# Patient Record
Sex: Female | Born: 1990 | Race: White | Hispanic: No | Smoking: Never smoker
Health system: Southern US, Community
[De-identification: ages and names within clinical notes are randomized; demographics above are authoritative.]

## PROBLEM LIST (undated history)

## (undated) DIAGNOSIS — J45909 Unspecified asthma, uncomplicated: Secondary | ICD-10-CM

## (undated) DIAGNOSIS — T7840XA Allergy, unspecified, initial encounter: Secondary | ICD-10-CM

## (undated) DIAGNOSIS — I10 Essential (primary) hypertension: Secondary | ICD-10-CM

## (undated) DIAGNOSIS — F32A Depression, unspecified: Secondary | ICD-10-CM

## (undated) HISTORY — DX: Depression, unspecified: F32.A

## (undated) HISTORY — DX: Unspecified asthma, uncomplicated: J45.909

## (undated) HISTORY — DX: Allergy, unspecified, initial encounter: T78.40XA

## (undated) HISTORY — PX: WISDOM TOOTH EXTRACTION: SHX21

---
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
## 2011-11-08 ENCOUNTER — Emergency Department: Payer: Self-pay | Admitting: Internal Medicine

## 2013-04-22 ENCOUNTER — Emergency Department: Payer: Self-pay | Admitting: Emergency Medicine

## 2017-03-02 ENCOUNTER — Encounter: Payer: Self-pay | Admitting: Emergency Medicine

## 2017-03-02 DIAGNOSIS — I10 Essential (primary) hypertension: Secondary | ICD-10-CM | POA: Insufficient documentation

## 2017-03-02 DIAGNOSIS — R109 Unspecified abdominal pain: Secondary | ICD-10-CM | POA: Diagnosis present

## 2017-03-02 DIAGNOSIS — M791 Myalgia: Secondary | ICD-10-CM | POA: Insufficient documentation

## 2017-03-02 LAB — URINALYSIS, COMPLETE (UACMP) WITH MICROSCOPIC
Bilirubin Urine: NEGATIVE
Glucose, UA: NEGATIVE mg/dL
Ketones, ur: 5 mg/dL — AB
Nitrite: NEGATIVE
PROTEIN: 30 mg/dL — AB
Specific Gravity, Urine: 1.031 — ABNORMAL HIGH (ref 1.005–1.030)
pH: 5 (ref 5.0–8.0)

## 2017-03-02 LAB — POCT PREGNANCY, URINE: PREG TEST UR: NEGATIVE

## 2017-03-02 NOTE — ED Triage Notes (Signed)
Patient with right side pain, diarrhea and decrease appetite that started yesterday.

## 2017-03-03 ENCOUNTER — Emergency Department: Payer: Medicaid Other

## 2017-03-03 ENCOUNTER — Emergency Department
Admission: EM | Admit: 2017-03-03 | Discharge: 2017-03-03 | Disposition: A | Discharge status: Home / Self Care | Payer: Medicaid Other | Attending: Emergency Medicine | Admitting: Emergency Medicine

## 2017-03-03 DIAGNOSIS — M7918 Myalgia, other site: Secondary | ICD-10-CM

## 2017-03-03 HISTORY — DX: Essential (primary) hypertension: I10

## 2017-03-03 LAB — COMPREHENSIVE METABOLIC PANEL
ALBUMIN: 4.1 g/dL (ref 3.5–5.0)
ALT: 18 U/L (ref 14–54)
AST: 22 U/L (ref 15–41)
Alkaline Phosphatase: 48 U/L (ref 38–126)
Anion gap: 7 (ref 5–15)
BUN: 8 mg/dL (ref 6–20)
CHLORIDE: 108 mmol/L (ref 101–111)
CO2: 25 mmol/L (ref 22–32)
CREATININE: 0.87 mg/dL (ref 0.44–1.00)
Calcium: 9.3 mg/dL (ref 8.9–10.3)
GFR calc Af Amer: >60 mL/min (ref >60)
GLUCOSE: 98 mg/dL (ref 65–99)
POTASSIUM: 3.3 mmol/L — AB (ref 3.5–5.1)
Sodium: 140 mmol/L (ref 135–145)
Total Bilirubin: 0.8 mg/dL (ref 0.3–1.2)
Total Protein: 7.3 g/dL (ref 6.5–8.1)

## 2017-03-03 LAB — CBC
HEMATOCRIT: 38.4 % (ref 35.0–47.0)
Hemoglobin: 13.2 g/dL (ref 12.0–16.0)
MCH: 29.3 pg (ref 26.0–34.0)
MCHC: 34.3 g/dL (ref 32.0–36.0)
MCV: 85.5 fL (ref 80.0–100.0)
PLATELETS: 278 10*3/uL (ref 150–440)
RBC: 4.5 MIL/uL (ref 3.80–5.20)
RDW: 14.9 % — ABNORMAL HIGH (ref 11.5–14.5)
WBC: 5.3 10*3/uL (ref 3.6–11.0)

## 2017-03-03 LAB — LIPASE, BLOOD: LIPASE: 24 U/L (ref 11–51)

## 2017-03-03 LAB — CK: Total CK: 99 U/L (ref 38–234)

## 2017-03-03 NOTE — Discharge Instructions (Signed)
Fortunately today your blood work and her CT scan were reassuring. Please follow-up with your primary care physician as needed and return to the emergency department for any concerns.  It was a pleasure to take care of you today, and thank you for coming to our emergency department.  If you have any questions or concerns before leaving please ask the nurse to grab me and I'm more than happy to go through your aftercare instructions again.  If you were prescribed any opioid pain medication today such as Norco, Vicodin, Percocet, morphine, hydrocodone, or oxycodone please make sure you do not drive when you are taking this medication as it can alter your ability to drive safely.  If you have any concerns once you are home that you are not improving or are in fact getting worse before you can make it to your follow-up appointment, please do not hesitate to call 911 and come back for further evaluation.  Darel Hong, MD  Results for orders placed or performed during the hospital encounter of 03/03/17  Lipase, blood  Result Value Ref Range   Lipase 24 11 - 51 U/L  Comprehensive metabolic panel  Result Value Ref Range   Sodium 140 135 - 145 mmol/L   Potassium 3.3 (L) 3.5 - 5.1 mmol/L   Chloride 108 101 - 111 mmol/L   CO2 25 22 - 32 mmol/L   Glucose, Bld 98 65 - 99 mg/dL   BUN 8 6 - 20 mg/dL   Creatinine, Ser 0.87 0.44 - 1.00 mg/dL   Calcium 9.3 8.9 - 10.3 mg/dL   Total Protein 7.3 6.5 - 8.1 g/dL   Albumin 4.1 3.5 - 5.0 g/dL   AST 22 15 - 41 U/L   ALT 18 14 - 54 U/L   Alkaline Phosphatase 48 38 - 126 U/L   Total Bilirubin 0.8 0.3 - 1.2 mg/dL   GFR calc non Af Amer >60 >60 mL/min   GFR calc Af Amer >60 >60 mL/min   Anion gap 7 5 - 15  CBC  Result Value Ref Range   WBC 5.3 3.6 - 11.0 K/uL   RBC 4.50 3.80 - 5.20 MIL/uL   Hemoglobin 13.2 12.0 - 16.0 g/dL   HCT 38.4 35.0 - 47.0 %   MCV 85.5 80.0 - 100.0 fL   MCH 29.3 26.0 - 34.0 pg   MCHC 34.3 32.0 - 36.0 g/dL   RDW 14.9 (H) 11.5 -  14.5 %   Platelets 278 150 - 440 K/uL  Urinalysis, Complete w Microscopic  Result Value Ref Range   Color, Urine AMBER (A) YELLOW   APPearance TURBID (A) CLEAR   Specific Gravity, Urine 1.031 (H) 1.005 - 1.030   pH 5.0 5.0 - 8.0   Glucose, UA NEGATIVE NEGATIVE mg/dL   Hgb urine dipstick LARGE (A) NEGATIVE   Bilirubin Urine NEGATIVE NEGATIVE   Ketones, ur 5 (A) NEGATIVE mg/dL   Protein, ur 30 (A) NEGATIVE mg/dL   Nitrite NEGATIVE NEGATIVE   Leukocytes, UA MODERATE (A) NEGATIVE   RBC / HPF 0-5 0 - 5 RBC/hpf   WBC, UA 6-30 0 - 5 WBC/hpf   Bacteria, UA RARE (A) NONE SEEN   Squamous Epithelial / LPF 6-30 (A) NONE SEEN   Mucous PRESENT    Amorphous Crystal PRESENT   CK  Result Value Ref Range   Total CK 99 38 - 234 U/L  Pregnancy, urine POC  Result Value Ref Range   Preg Test, Ur NEGATIVE NEGATIVE  Ct Renal Stone Study  Result Date: 03/03/2017 CLINICAL DATA:  Initial evaluation for acute flank pain. EXAM: CT ABDOMEN AND PELVIS WITHOUT CONTRAST TECHNIQUE: Multidetector CT imaging of the abdomen and pelvis was performed following the standard protocol without IV contrast. COMPARISON:  None. FINDINGS: Lower chest: Lung bases are clear. Hepatobiliary: Liver demonstrates a normal unenhanced appearance. Gallbladder normal. No biliary dilatation. Pancreas: Pancreas within normal limits. Spleen: Spleen within normal limits. Adrenals/Urinary Tract: Adrenal glands are normal. Kidneys equal in size without evidence for nephrolithiasis or hydronephrosis. No radiopaque calculi seen along the course of either renal collecting system. No hydroureter. Bladder partially distended without acute abnormality. No layering stones within the bladder lumen. Stomach/Bowel: Stomach within normal limits. No evidence for bowel obstruction. Appendix within normal limits. No acute inflammatory changes seen about the bowels. Vascular/Lymphatic: Intra-abdominal aorta of normal caliber. No adenopathy. Reproductive: Uterus  and ovaries within normal limits. Other: No free air or fluid. Fat containing paraumbilical hernia noted. Musculoskeletal: No acute osseus abnormality. No worrisome lytic or blastic osseous lesions. 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 IMPRESSION: 1. No CT evidence for nephrolithiasis or obstructive uropathy. 2. No other acute intra-abdominal or pelvic process. 3. Chronic bilateral pars defects at L5 with associated grade 1 spondylolisthesis. Electronically Signed   By: Jeannine Boga M.D.   On: 03/03/2017 03:35

## 2017-03-03 NOTE — ED Provider Notes (Signed)
[facility] Emergency Department Provider Note  ____________________________________________   First MD Initiated Contact with Patient 03/03/17 0302     (approximate)  I have reviewed the triage vital signs and the nursing notes.   HISTORY  Chief Complaint Abdominal Pain    HPI Denise Contreras is a 26 y.o. female who comes to the emergency Department with roughly 1 week of right-sided abdominal pain. The pain is mild to moderate. It is worse with exercising improved when not exercising. She denies dysuria frequency hesitancy. She denies fevers or chills. She denies abdominal surgical history. She says she has recently started going to the gym and is alert he lost 15 pounds and she is not sure if she's overexerting herself or not. Her friend said it could be her gallbladder so she is worried. Her pain is not postprandial.   Past Medical History:  Diagnosis Date  . Hypertension     There are no active problems to display for this patient.   History reviewed. No pertinent surgical history.  Prior to Admission medications   Not on File    Allergies Sulfa antibiotics  No family history on file.  Social History Social History  Substance Use Topics  . Smoking status: Never Smoker  . Smokeless tobacco: Never Used  . Alcohol use Not on file    Review of Systems Constitutional: No fever/chills Eyes: No visual changes. ENT: No sore throat. Cardiovascular: Denies chest pain. Respiratory: Denies shortness of breath. Gastrointestinal: Positive abdominal pain.  No nausea, no vomiting.  No diarrhea.  No constipation. Genitourinary: Negative for dysuria. Musculoskeletal: Positive for back pain. Skin: Negative for rash. Neurological: Negative for headaches, focal weakness or numbness.   ____________________________________________   PHYSICAL EXAM:  VITAL SIGNS: ED Triage Vitals  Enc Vitals Group     BP 03/02/17 2326 (!) 149/95     Pulse  Rate 03/02/17 2326 65     Resp 03/02/17 2326 18     Temp 03/02/17 2326 98.6 F (37 C)     Temp Source 03/02/17 2326 Oral     SpO2 03/02/17 2326 99 %     Weight 03/02/17 2320 280 lb (127 kg)     Height 03/02/17 2320 5\' 3"  (1.6 m)     Head Circumference --      Peak Flow --      Pain Score 03/02/17 2320 1     Pain Loc --      Pain Edu? --      Excl. in [place]? --     Constitutional: Alert and oriented 4 well appearing nontoxic no diaphoresis speaks in full clear sentences Eyes: PERRL EOMI. Head: Atraumatic. Nose: No congestion/rhinnorhea. Mouth/Throat: No trismus Neck: No stridor.   Cardiovascular: Normal rate, regular rhythm. Grossly normal heart sounds.  Good peripheral circulation. Respiratory: Normal respiratory effort.  No retractions. Lungs CTAB and moving good air Gastrointestinal: Soft nondistended nontender no rebound or guarding no peritonitis no costovertebral tenderness negative Murphy's no McBurney's tenderness negative Rovsing's No midline back tenderness Musculoskeletal: No lower extremity edema   Neurologic:  Normal speech and language. No gross focal neurologic deficits are appreciated. Skin:  Skin is warm, dry and intact. No rash noted. Psychiatric: Mood and affect are normal. Speech and behavior are normal.    ____________________________________________   DIFFERENTIAL includes but not limited to  Musculoskeletal pain, rhabdomyolysis, renal colic, pyelonephritis, urinary tract infection ____________________________________________   LABS (all labs ordered are listed, but only abnormal results are displayed)  Labs Reviewed  COMPREHENSIVE METABOLIC PANEL - Abnormal; Notable for the following:       Result Value   Potassium 3.3 (*)    All other components within normal limits  CBC - Abnormal; Notable for the following:    RDW 14.9 (*)    All other components within normal limits  URINALYSIS, COMPLETE (UACMP) WITH MICROSCOPIC - Abnormal; Notable for the  following:    Color, Urine AMBER (*)    APPearance TURBID (*)    Specific Gravity, Urine 1.031 (*)    Hgb urine dipstick LARGE (*)    Ketones, ur 5 (*)    Protein, ur 30 (*)    Leukocytes, UA MODERATE (*)    Bacteria, UA RARE (*)    Squamous Epithelial / LPF 6-30 (*)    All other components within normal limits  LIPASE, BLOOD  CK  POC URINE PREG, ED  POCT PREGNANCY, URINE    Urine dipstick positive for blood with negative microscopy concerning for rhabdomyolysis although CK is normal. No evidence of infection __________________________________________  EKG   ____________________________________________  RADIOLOGY  CT scan renal protocol negative for acute pathology ____________________________________________   PROCEDURES  Procedure(s) performed: no  Procedures  Critical Care performed: no  Observation: no ____________________________________________   INITIAL IMPRESSION / ASSESSMENT AND PLAN / ED COURSE  Pertinent labs & imaging results that were available during my care of the patient were reviewed by me and considered in my medical decision making (see chart for details).  The patient arrives very well-appearing with a benign exam and declines pain medication. Her urinalysis is concerning for rhabdomyolysis especially in the setting of recently initiating work at the gym but fortunately her CK is negative. She has no infectious symptoms. Given the hematuria I was also concerned for renal colic however his CT scan is negative. This either represents a tiny stone that did not fit between the cuts of the CT scan versus musculoskeletal pain. Regardless she is medically stable for outpatient management   ____________________________________________   FINAL CLINICAL IMPRESSION(S) / ED DIAGNOSES  Final diagnoses:  Musculoskeletal pain      NEW MEDICATIONS STARTED DURING THIS VISIT:  There are no discharge medications for this patient.    Note:  This  document was prepared using Dragon voice recognition software and may include unintentional dictation errors.     Darel Hong, MD 03/03/17 0600

## 2017-03-03 NOTE — ED Notes (Signed)
Patient returned from CT

## 2018-06-20 DIAGNOSIS — S2221XA Fracture of manubrium, initial encounter for closed fracture: Secondary | ICD-10-CM | POA: Insufficient documentation

## 2018-08-19 DIAGNOSIS — I10 Essential (primary) hypertension: Secondary | ICD-10-CM | POA: Diagnosis not present

## 2018-08-19 DIAGNOSIS — J45909 Unspecified asthma, uncomplicated: Secondary | ICD-10-CM | POA: Diagnosis not present

## 2018-08-19 DIAGNOSIS — E669 Obesity, unspecified: Secondary | ICD-10-CM | POA: Diagnosis not present

## 2018-08-19 DIAGNOSIS — O0992 Supervision of high risk pregnancy, unspecified, second trimester: Secondary | ICD-10-CM | POA: Diagnosis not present

## 2018-08-19 DIAGNOSIS — O99210 Obesity complicating pregnancy, unspecified trimester: Secondary | ICD-10-CM | POA: Diagnosis not present

## 2018-08-19 DIAGNOSIS — Z6841 Body Mass Index (BMI) 40.0 and over, adult: Secondary | ICD-10-CM | POA: Diagnosis not present

## 2018-08-19 DIAGNOSIS — R319 Hematuria, unspecified: Secondary | ICD-10-CM | POA: Diagnosis not present

## 2018-08-21 DIAGNOSIS — Z3A22 22 weeks gestation of pregnancy: Secondary | ICD-10-CM | POA: Diagnosis not present

## 2018-08-21 DIAGNOSIS — O99212 Obesity complicating pregnancy, second trimester: Secondary | ICD-10-CM | POA: Diagnosis not present

## 2018-12-10 DIAGNOSIS — Z1159 Encounter for screening for other viral diseases: Secondary | ICD-10-CM | POA: Diagnosis not present

## 2018-12-12 DIAGNOSIS — O1002 Pre-existing essential hypertension complicating childbirth: Secondary | ICD-10-CM | POA: Diagnosis not present

## 2018-12-12 DIAGNOSIS — O9952 Diseases of the respiratory system complicating childbirth: Secondary | ICD-10-CM | POA: Diagnosis not present

## 2018-12-12 DIAGNOSIS — O99214 Obesity complicating childbirth: Secondary | ICD-10-CM | POA: Diagnosis not present

## 2018-12-12 DIAGNOSIS — Z3A39 39 weeks gestation of pregnancy: Secondary | ICD-10-CM | POA: Diagnosis not present

## 2018-12-12 DIAGNOSIS — E669 Obesity, unspecified: Secondary | ICD-10-CM | POA: Diagnosis not present

## 2018-12-12 DIAGNOSIS — O99824 Streptococcus B carrier state complicating childbirth: Secondary | ICD-10-CM | POA: Diagnosis not present

## 2018-12-12 DIAGNOSIS — O10013 Pre-existing essential hypertension complicating pregnancy, third trimester: Secondary | ICD-10-CM | POA: Diagnosis not present

## 2018-12-12 DIAGNOSIS — J45909 Unspecified asthma, uncomplicated: Secondary | ICD-10-CM | POA: Diagnosis not present

## 2018-12-28 DIAGNOSIS — Z130 Encounter for screening for diseases of the blood and blood-forming organs and certain disorders involving the immune mechanism: Secondary | ICD-10-CM | POA: Diagnosis not present

## 2019-05-31 ENCOUNTER — Telehealth: Payer: Self-pay

## 2019-05-31 NOTE — Telephone Encounter (Signed)
Pre-visit screening call attempted prior to [facility] appointment on 06/02/2019. Left a message - will call again at a later time.

## 2019-06-02 ENCOUNTER — Other Ambulatory Visit: Payer: Self-pay

## 2019-06-02 ENCOUNTER — Ambulatory Visit: Payer: Medicaid Other | Attending: Oncology

## 2019-06-02 VITALS — BP 145/93 | HR 71 | Temp 98.3°F | Resp 16 | Ht 64.0 in | Wt 327.0 lb

## 2019-06-02 DIAGNOSIS — N63 Unspecified lump in unspecified breast: Secondary | ICD-10-CM

## 2019-06-02 DIAGNOSIS — Z30013 Encounter for initial prescription of injectable contraceptive: Secondary | ICD-10-CM | POA: Diagnosis not present

## 2019-06-02 DIAGNOSIS — Z0000 Encounter for general adult medical examination without abnormal findings: Secondary | ICD-10-CM | POA: Diagnosis not present

## 2019-06-02 NOTE — Progress Notes (Signed)
  Subjective:     Patient ID: Denise Contreras, female   DOB: 1991/06/26, 28 y.o.   MRN: [id number]  HPI   Review of Systems     Objective:   Physical Exam Chest:       Comments: 1.5 cm breast mass 1 o'clock with associated erythema       Assessment:     28 year old patient presents for [place] clinic visit.  Patient complains of a lump  in right breast that has been there for about a month.  States she feels like it has grown in size.  Patient screened, and meets BCCCP eligibility.  Patient does not have insurance, Medicare or Medicaid. Instructed patient on breast self awareness using teach back method.   Palpated a 1.5 cm. Mass at 1 o'clock right breast.  Dime sized erythematous skin noted at mass location.    Plan:     Scheduled to return 06/09/19, due to patient's request for work schedule.

## 2019-06-02 NOTE — Progress Notes (Signed)
Scheduled 06/09/2019 for ultrasound cancelled per patient.  Rescheduled for 06/29/2019, and results Birads 4.  [facility]ers in for biopsy.

## 2019-06-09 ENCOUNTER — Inpatient Hospital Stay: Admission: RE | Admit: 2019-06-09 | Payer: Medicaid Other | Source: Ambulatory Visit

## 2019-06-16 DIAGNOSIS — R0981 Nasal congestion: Secondary | ICD-10-CM | POA: Diagnosis not present

## 2019-06-16 DIAGNOSIS — Z882 Allergy status to sulfonamides status: Secondary | ICD-10-CM | POA: Diagnosis not present

## 2019-06-16 DIAGNOSIS — R0602 Shortness of breath: Secondary | ICD-10-CM | POA: Diagnosis not present

## 2019-06-16 DIAGNOSIS — Z79899 Other long term (current) drug therapy: Secondary | ICD-10-CM | POA: Diagnosis not present

## 2019-06-16 DIAGNOSIS — R05 Cough: Secondary | ICD-10-CM | POA: Diagnosis not present

## 2019-06-16 DIAGNOSIS — J45901 Unspecified asthma with (acute) exacerbation: Secondary | ICD-10-CM | POA: Diagnosis not present

## 2019-06-21 ENCOUNTER — Other Ambulatory Visit: Payer: Medicaid Other

## 2019-06-22 ENCOUNTER — Other Ambulatory Visit: Payer: Medicaid Other

## 2019-06-29 ENCOUNTER — Ambulatory Visit
Admission: RE | Admit: 2019-06-29 | Discharge: 2019-06-29 | Disposition: A | Discharge status: Home / Self Care | Payer: Medicaid Other | Source: Ambulatory Visit | Attending: Oncology | Admitting: Oncology

## 2019-06-29 DIAGNOSIS — N63 Unspecified lump in unspecified breast: Secondary | ICD-10-CM | POA: Insufficient documentation

## 2019-06-30 ENCOUNTER — Other Ambulatory Visit: Payer: Self-pay | Admitting: *Deleted

## 2019-06-30 DIAGNOSIS — N63 Unspecified lump in unspecified breast: Secondary | ICD-10-CM

## 2019-07-07 ENCOUNTER — Ambulatory Visit
Admission: RE | Admit: 2019-07-07 | Discharge: 2019-07-07 | Disposition: A | Discharge status: Home / Self Care | Payer: Medicaid Other | Source: Ambulatory Visit | Attending: Oncology | Admitting: Oncology

## 2019-07-07 DIAGNOSIS — N63 Unspecified lump in unspecified breast: Secondary | ICD-10-CM

## 2019-07-07 DIAGNOSIS — N6121 Granulomatous mastitis, right breast: Secondary | ICD-10-CM | POA: Diagnosis not present

## 2019-07-09 ENCOUNTER — Encounter: Payer: Self-pay | Admitting: *Deleted

## 2019-07-09 LAB — SURGICAL PATHOLOGY

## 2019-07-09 NOTE — Progress Notes (Signed)
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  Informed her she will need to be treated with antibiotics. Since the surgical offices are closed for the day, patient was informed that I will call her on Monday and get her scheduled.  She is agreeable.

## 2019-07-12 ENCOUNTER — Encounter: Payer: Self-pay | Admitting: *Deleted

## 2019-07-12 NOTE — Progress Notes (Addendum)
Left patient a message with her surgical consult appointment with Dr. Bary Castilla tomorrow at 10:00.  I requested that she confirm with a call back.  Patient returned my call and confirmed her appointment for tomorrow.

## 2019-08-04 NOTE — Progress Notes (Signed)
Received note from [facility] that patient was a no show for her appointment. This appointment was rescheduled from a previous appointment.  Left message for patient to call back with her plan to receive treatment.

## 2019-08-31 DIAGNOSIS — Z3042 Encounter for surveillance of injectable contraceptive: Secondary | ICD-10-CM | POA: Diagnosis not present

## 2019-08-31 DIAGNOSIS — Z3009 Encounter for other general counseling and advice on contraception: Secondary | ICD-10-CM | POA: Diagnosis not present

## 2019-09-02 DIAGNOSIS — N61 Mastitis without abscess: Secondary | ICD-10-CM | POA: Diagnosis not present

## 2019-09-08 ENCOUNTER — Encounter: Payer: Self-pay | Admitting: *Deleted

## 2019-09-08 NOTE — Progress Notes (Signed)
Patient was seen by Dr. Bary Castilla.  She has had complete resolution of the granulamatous.  She will follow up as needed.

## 2019-09-14 DIAGNOSIS — Z23 Encounter for immunization: Secondary | ICD-10-CM | POA: Diagnosis not present

## 2019-10-28 DIAGNOSIS — Z6841 Body Mass Index (BMI) 40.0 and over, adult: Secondary | ICD-10-CM | POA: Diagnosis not present

## 2019-10-28 DIAGNOSIS — I10 Essential (primary) hypertension: Secondary | ICD-10-CM | POA: Insufficient documentation

## 2019-10-28 DIAGNOSIS — R635 Abnormal weight gain: Secondary | ICD-10-CM | POA: Diagnosis not present

## 2019-10-28 DIAGNOSIS — Z3009 Encounter for other general counseling and advice on contraception: Secondary | ICD-10-CM | POA: Diagnosis not present

## 2019-11-19 NOTE — Progress Notes (Signed)
Copy to [place] for data to the [place].

## 2019-11-24 DIAGNOSIS — I10 Essential (primary) hypertension: Secondary | ICD-10-CM | POA: Diagnosis not present

## 2019-11-24 DIAGNOSIS — Z6841 Body Mass Index (BMI) 40.0 and over, adult: Secondary | ICD-10-CM | POA: Diagnosis not present

## 2019-11-24 DIAGNOSIS — Z3041 Encounter for surveillance of contraceptive pills: Secondary | ICD-10-CM | POA: Diagnosis not present

## 2019-11-24 DIAGNOSIS — J4520 Mild intermittent asthma, uncomplicated: Secondary | ICD-10-CM | POA: Insufficient documentation

## 2019-11-24 DIAGNOSIS — J309 Allergic rhinitis, unspecified: Secondary | ICD-10-CM | POA: Insufficient documentation

## 2019-11-24 DIAGNOSIS — Z3202 Encounter for pregnancy test, result negative: Secondary | ICD-10-CM | POA: Diagnosis not present

## 2020-01-27 DIAGNOSIS — Z419 Encounter for procedure for purposes other than remedying health state, unspecified: Secondary | ICD-10-CM | POA: Diagnosis not present

## 2020-02-04 DIAGNOSIS — Z3202 Encounter for pregnancy test, result negative: Secondary | ICD-10-CM | POA: Diagnosis not present

## 2020-02-04 DIAGNOSIS — Z308 Encounter for other contraceptive management: Secondary | ICD-10-CM | POA: Diagnosis not present

## 2020-02-04 DIAGNOSIS — I10 Essential (primary) hypertension: Secondary | ICD-10-CM | POA: Diagnosis not present

## 2020-02-27 DIAGNOSIS — Z419 Encounter for procedure for purposes other than remedying health state, unspecified: Secondary | ICD-10-CM | POA: Diagnosis not present

## 2020-03-28 IMAGING — MG US  BREAST BX W/ LOC DEV 1ST LESION IMG BX SPEC US GUIDE*R*
1 series · 8 of 8 positions shown · non-contrast
Comparison: Previous exam(s).
COMPARISON: Previous exam(s).

Addendum:
CLINICAL DATA: 28-year-old female for tissue sampling of UPPER
INNER RIGHT breast mass.

EXAM:
ULTRASOUND GUIDED RIGHT BREAST CORE NEEDLE BIOPSY

[Series 1: MG view · 0.04mm/px · 8 of 11 slices shown]
[im 1/11]
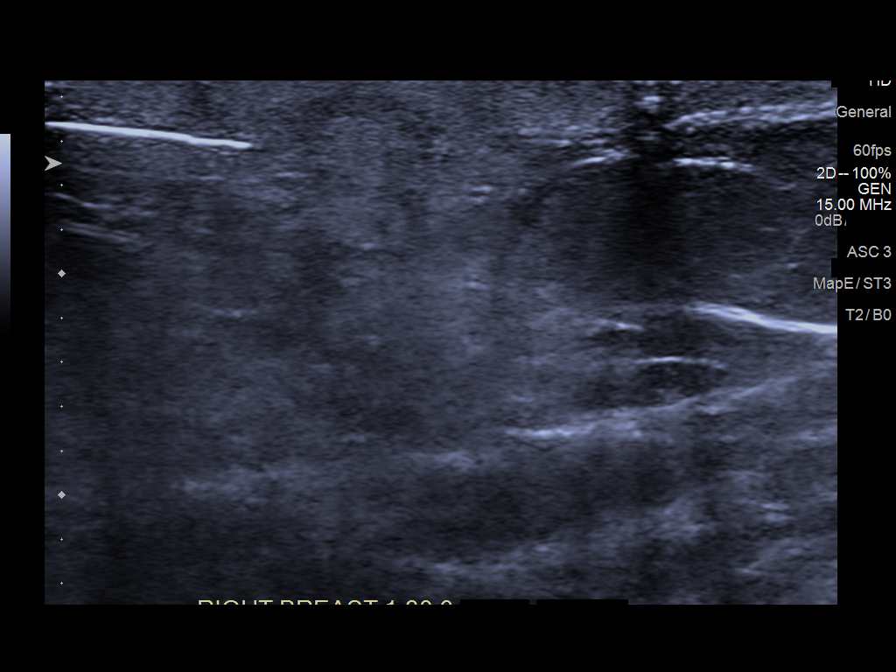
[im 2/11]
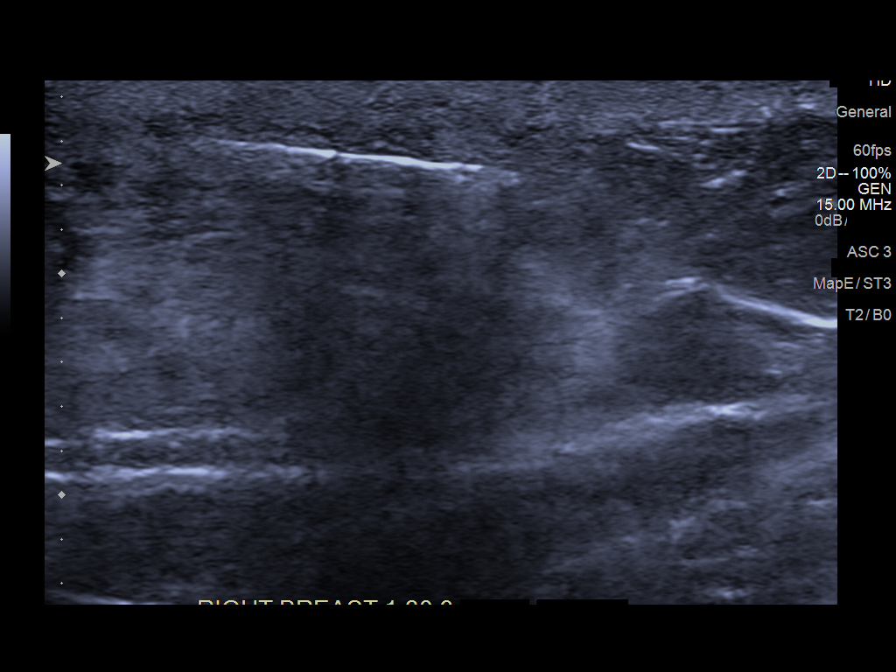
[im 3/11]
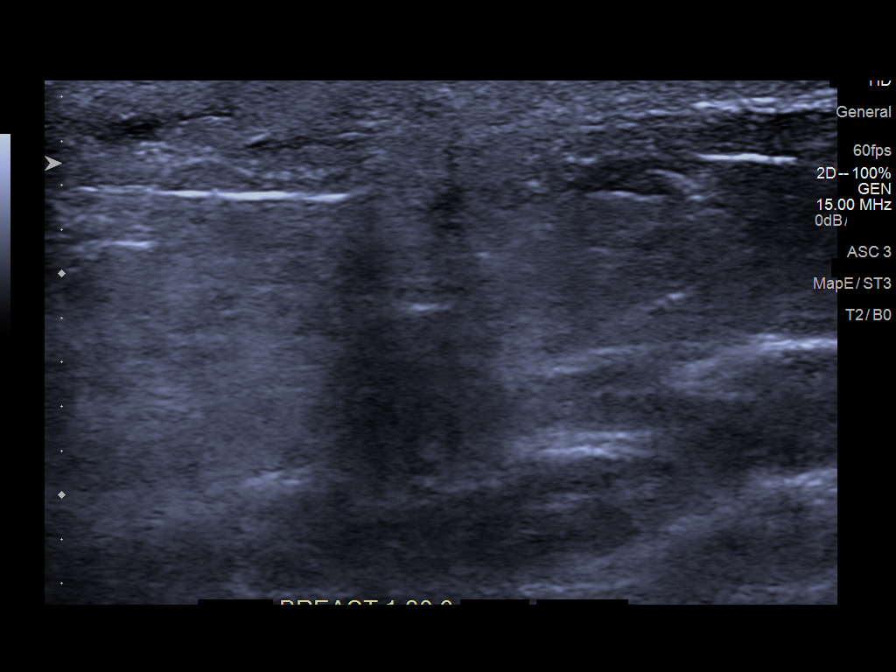
[im 5/11]
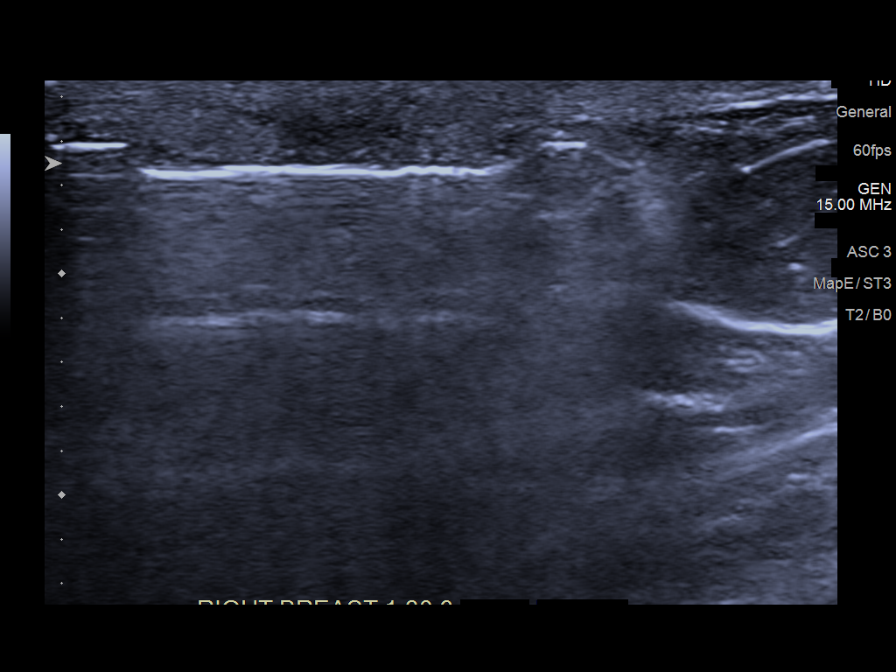
[im 6/11]
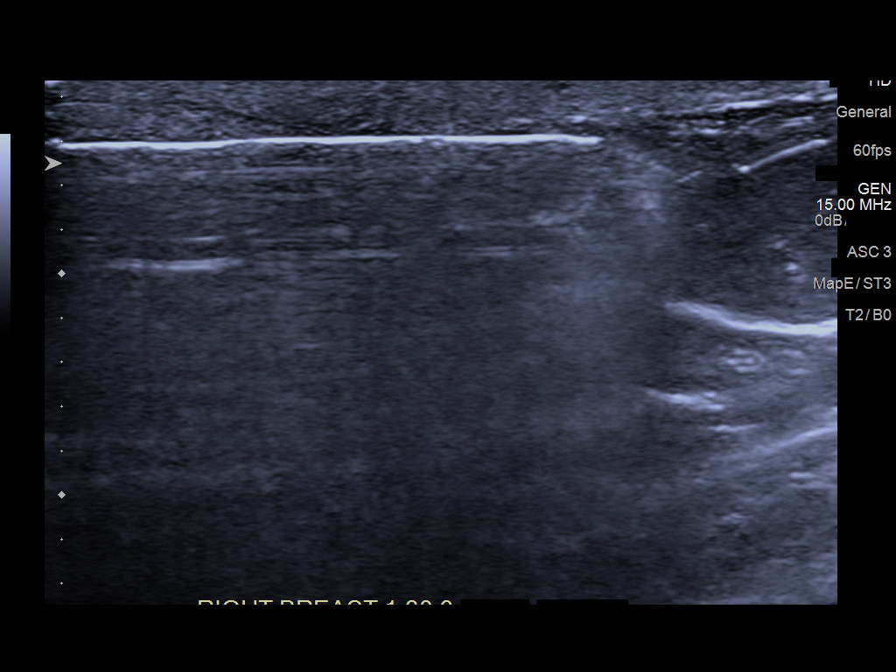
[im 8/11]
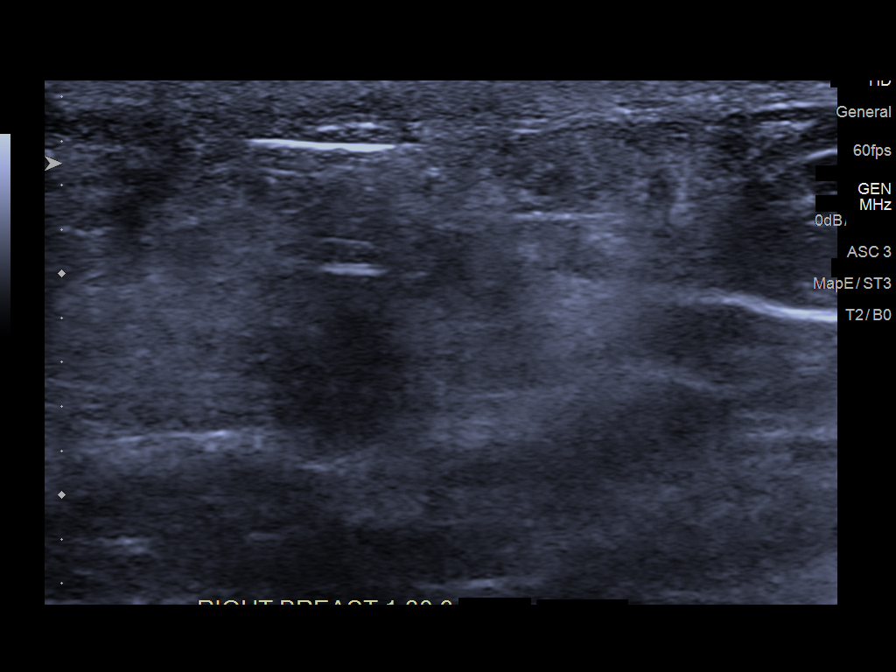
[im 9/11]
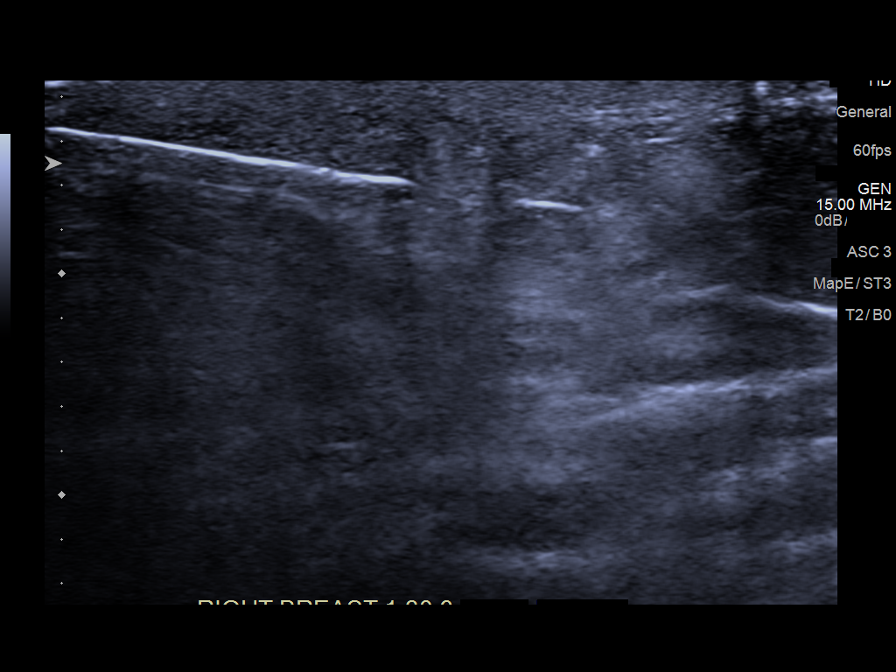
[im 11/11]
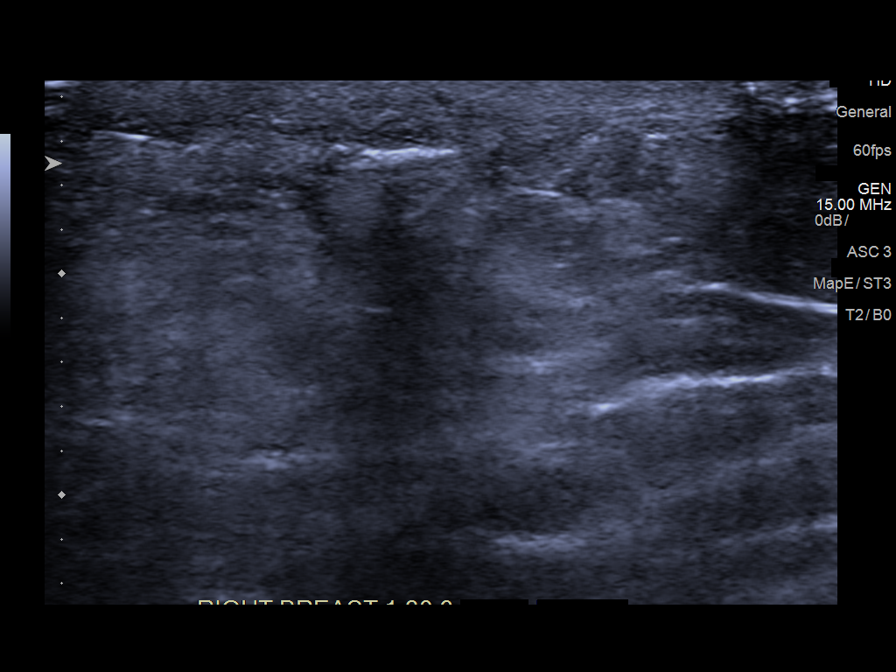

[8 of 8 positions shown; findings below may reference images not displayed]



Lesion quadrant: UPPER INNER

Using sterile technique and 1% Lidocaine as local anesthetic, under
direct ultrasound visualization, a 14 gauge Schreiber device was
used to perform biopsy of the 1.9 cm superficial hyperechoic mass at
the [DATE] position of the RIGHT breast 6 cm from the nipple using a
LATERAL approach. At the conclusion of the procedure HydroMARK
tissue marker clip was deployed into the biopsy cavity with
satisfactory placement confirmed sonographically.
IMPRESSION: Ultrasound guided biopsy of 1.9 cm UPPER INNER RIGHT breast mass. No
apparent complications.

Satisfactory placement of HydroMARK biopsy clip within the mass,
confirmed sonographically.

ADDENDUM:
PATHOLOGY revealed: A. BREAST, RIGHT [DATE] 6 CM FN; ULTRASOUND-GUIDED
BIOPSY: - LOBULAR GRANULOMATOUS MASTITIS. - AFB, ROUSE, AND GRAM
STAINS ARE NEGATIVE; STAIN CONTROLS WORKED APPROPRIATELY.

Pathology results are CONCORDANT with imaging findings, per Dr. Gc
Benny.

I telephoned the patient on 07/09/2019 and discussed biopsy results
and recommendations stated below. All questions were answered. The
patient denies significant pain or bleeding from the biopsy site.
Audberto Bosley RN called patient on 07/09/2019 and informed her that
she may need antibiotic treatment, and Blade Aujla her [REDACTED]
to schedule appointment for patient to see Dr. Nyi Iteung. Biopsy site
care instructions were reviewed and patient was instructed to call
[HOSPITAL] with any concerns or questions related to the
biopsy.

Recommendation: Surgical referral. Request for surgical referral was
relayed to nurse navigators at [HOSPITAL] [HOSPITAL] by
Nivirus Databex RN on 07/12/2019.

Addendum by Nivirus Databex RN on 07/12/2019.



Lesion quadrant: UPPER INNER

Using sterile technique and 1% Lidocaine as local anesthetic, under
direct ultrasound visualization, a 14 gauge Schreiber device was
used to perform biopsy of the 1.9 cm superficial hyperechoic mass at
the [DATE] position of the RIGHT breast 6 cm from the nipple using a
LATERAL approach. At the conclusion of the procedure HydroMARK
tissue marker clip was deployed into the biopsy cavity with
satisfactory placement confirmed sonographically.
IMPRESSION: Ultrasound guided biopsy of 1.9 cm UPPER INNER RIGHT breast mass. No
apparent complications.

Satisfactory placement of HydroMARK biopsy clip within the mass,
confirmed sonographically.

## 2020-03-29 DIAGNOSIS — Z419 Encounter for procedure for purposes other than remedying health state, unspecified: Secondary | ICD-10-CM | POA: Diagnosis not present

## 2020-04-28 DIAGNOSIS — Z124 Encounter for screening for malignant neoplasm of cervix: Secondary | ICD-10-CM | POA: Diagnosis not present

## 2020-04-28 DIAGNOSIS — Z419 Encounter for procedure for purposes other than remedying health state, unspecified: Secondary | ICD-10-CM | POA: Diagnosis not present

## 2020-04-28 DIAGNOSIS — J4520 Mild intermittent asthma, uncomplicated: Secondary | ICD-10-CM | POA: Diagnosis not present

## 2020-04-28 DIAGNOSIS — J309 Allergic rhinitis, unspecified: Secondary | ICD-10-CM | POA: Diagnosis not present

## 2020-04-28 DIAGNOSIS — Z6841 Body Mass Index (BMI) 40.0 and over, adult: Secondary | ICD-10-CM | POA: Diagnosis not present

## 2020-04-28 DIAGNOSIS — J209 Acute bronchitis, unspecified: Secondary | ICD-10-CM | POA: Diagnosis not present

## 2020-04-28 DIAGNOSIS — I10 Essential (primary) hypertension: Secondary | ICD-10-CM | POA: Diagnosis not present

## 2020-04-28 DIAGNOSIS — Z0000 Encounter for general adult medical examination without abnormal findings: Secondary | ICD-10-CM | POA: Diagnosis not present

## 2020-04-28 DIAGNOSIS — N898 Other specified noninflammatory disorders of vagina: Secondary | ICD-10-CM | POA: Diagnosis not present

## 2020-05-29 DIAGNOSIS — Z419 Encounter for procedure for purposes other than remedying health state, unspecified: Secondary | ICD-10-CM | POA: Diagnosis not present

## 2020-06-28 DIAGNOSIS — Z419 Encounter for procedure for purposes other than remedying health state, unspecified: Secondary | ICD-10-CM | POA: Diagnosis not present

## 2020-07-03 DIAGNOSIS — H5213 Myopia, bilateral: Secondary | ICD-10-CM | POA: Diagnosis not present

## 2020-07-05 ENCOUNTER — Ambulatory Visit
Admission: RE | Admit: 2020-07-05 | Discharge: 2020-07-05 | Disposition: A | Discharge status: Home / Self Care | Payer: Medicaid Other | Source: Ambulatory Visit | Attending: Adult Health | Admitting: Adult Health

## 2020-07-05 ENCOUNTER — Ambulatory Visit (INDEPENDENT_AMBULATORY_CARE_PROVIDER_SITE_OTHER): Payer: Medicaid Other | Admitting: Adult Health

## 2020-07-05 ENCOUNTER — Other Ambulatory Visit: Payer: Self-pay

## 2020-07-05 ENCOUNTER — Ambulatory Visit
Admission: RE | Admit: 2020-07-05 | Discharge: 2020-07-05 | Disposition: A | Discharge status: Home / Self Care | Payer: Medicaid Other | Attending: Adult Health | Admitting: Adult Health

## 2020-07-05 ENCOUNTER — Encounter: Payer: Self-pay | Admitting: Adult Health

## 2020-07-05 VITALS — BP 144/87 | HR 73 | Temp 97.9°F | Resp 16 | Ht 63.0 in | Wt 347.8 lb

## 2020-07-05 DIAGNOSIS — J4540 Moderate persistent asthma, uncomplicated: Secondary | ICD-10-CM | POA: Diagnosis not present

## 2020-07-05 DIAGNOSIS — E559 Vitamin D deficiency, unspecified: Secondary | ICD-10-CM | POA: Diagnosis not present

## 2020-07-05 DIAGNOSIS — Z1389 Encounter for screening for other disorder: Secondary | ICD-10-CM

## 2020-07-05 DIAGNOSIS — Z6841 Body Mass Index (BMI) 40.0 and over, adult: Secondary | ICD-10-CM | POA: Diagnosis not present

## 2020-07-05 DIAGNOSIS — M545 Low back pain, unspecified: Secondary | ICD-10-CM

## 2020-07-05 DIAGNOSIS — Z01419 Encounter for gynecological examination (general) (routine) without abnormal findings: Secondary | ICD-10-CM | POA: Insufficient documentation

## 2020-07-05 DIAGNOSIS — M47817 Spondylosis without myelopathy or radiculopathy, lumbosacral region: Secondary | ICD-10-CM | POA: Diagnosis not present

## 2020-07-05 DIAGNOSIS — F321 Major depressive disorder, single episode, moderate: Secondary | ICD-10-CM | POA: Diagnosis not present

## 2020-07-05 DIAGNOSIS — I10 Essential (primary) hypertension: Secondary | ICD-10-CM | POA: Diagnosis not present

## 2020-07-05 MED ORDER — LORATADINE 10 MG PO TABS: 10.0000 mg | ORAL_TABLET | Freq: Every day | ORAL | 1 refills | Status: DC

## 2020-07-05 MED ORDER — LISINOPRIL 20 MG PO TABS: 20.0000 mg | ORAL_TABLET | Freq: Every day | ORAL | 1 refills | Status: DC

## 2020-07-05 MED ORDER — BUPROPION HCL ER (XL) 300 MG PO TB24: 300.0000 mg | ORAL_TABLET | Freq: Every day | ORAL | 1 refills | Status: DC

## 2020-07-05 MED ORDER — ALBUTEROL SULFATE HFA 108 (90 BASE) MCG/ACT IN AERS: 1.0000 | INHALATION_SPRAY | Freq: Four times a day (QID) | RESPIRATORY_TRACT | 0 refills | Status: DC | PRN

## 2020-07-05 MED ORDER — IPRATROPIUM-ALBUTEROL 0.5-2.5 (3) MG/3ML IN SOLN: 3.0000 mL | Freq: Four times a day (QID) | RESPIRATORY_TRACT | 1 refills | Status: DC | PRN

## 2020-07-05 NOTE — Patient Instructions (Signed)
Calorie Counting for Weight Loss Calories are units of energy. Your body needs a certain amount of calories from food to keep you going throughout the day. When you eat more calories than your body needs, your body stores the extra calories as fat. When you eat fewer calories than your body needs, your body burns fat to get the energy it needs. Calorie counting means keeping track of how many calories you eat and drink each day. Calorie counting can be helpful if you need to lose weight. If you make sure to eat fewer calories than your body needs, you should lose weight. Ask your health care provider what a healthy weight is for you. For calorie counting to work, you will need to eat the right number of calories in a day in order to lose a healthy amount of weight per week. A dietitian can help you determine how many calories you need in a day and will give you suggestions on how to reach your calorie goal.  A healthy amount of weight to lose per week is usually 1-2 lb (0.5-0.9 kg). This usually means that your daily calorie intake should be reduced by 500-750 calories.  Eating 1,200 - 1,500 calories per day can help most women lose weight.  Eating 1,500 - 1,800 calories per day can help most men lose weight. What is my plan? My goal is to have __________ calories per day. If I have this many calories per day, I should lose around __________ pounds per week. What do I need to know about calorie counting? In order to meet your daily calorie goal, you will need to:  Find out how many calories are in each food you would like to eat. Try to do this before you eat.  Decide how much of the food you plan to eat.  Write down what you ate and how many calories it had. Doing this is called keeping a food log. To successfully lose weight, it is important to balance calorie counting with a healthy lifestyle that includes regular activity. Aim for 150 minutes of moderate exercise (such as walking) or 75  minutes of vigorous exercise (such as running) each week. Where do I find calorie information?  The number of calories in a food can be found on a Nutrition Facts label. If a food does not have a Nutrition Facts label, try to look up the calories online or ask your dietitian for help. Remember that calories are listed per serving. If you choose to have more than one serving of a food, you will have to multiply the calories per serving by the amount of servings you plan to eat. For example, the label on a package of bread might say that a serving size is 1 slice and that there are 90 calories in a serving. If you eat 1 slice, you will have eaten 90 calories. If you eat 2 slices, you will have eaten 180 calories. How do I keep a food log? Immediately after each meal, record the following information in your food log:  What you ate. Don't forget to include toppings, sauces, and other extras on the food.  How much you ate. This can be measured in cups, ounces, or number of items.  How many calories each food and drink had.  The total number of calories in the meal. Keep your food log near you, such as in a small notebook in your pocket, or use a mobile app or website. Some programs will calculate   calories for you and show you how many calories you have left for the day to meet your goal. What are some calorie counting tips?   Use your calories on foods and drinks that will fill you up and not leave you hungry: ? Some examples of foods that fill you up are nuts and nut butters, vegetables, lean proteins, and high-fiber foods like whole grains. High-fiber foods are foods with more than 5 g fiber per serving. ? Drinks such as sodas, specialty coffee drinks, alcohol, and juices have a lot of calories, yet do not fill you up.  Eat nutritious foods and avoid empty calories. Empty calories are calories you get from foods or beverages that do not have many vitamins or protein, such as candy, sweets, and  soda. It is better to have a nutritious high-calorie food (such as an avocado) than a food with few nutrients (such as a bag of chips).  Know how many calories are in the foods you eat most often. This will help you calculate calorie counts faster.  Pay attention to calories in drinks. Low-calorie drinks include water and unsweetened drinks.  Pay attention to nutrition labels for "low fat" or "fat free" foods. These foods sometimes have the same amount of calories or more calories than the full fat versions. They also often have added sugar, starch, or salt, to make up for flavor that was removed with the fat.  Find a way of tracking calories that works for you. Get creative. Try different apps or programs if writing down calories does not work for you. What are some portion control tips?  Know how many calories are in a serving. This will help you know how many servings of a certain food you can have.  Use a measuring cup to measure serving sizes. You could also try weighing out portions on a kitchen scale. With time, you will be able to estimate serving sizes for some foods.  Take some time to put servings of different foods on your favorite plates, bowls, and cups so you know what a serving looks like.  Try not to eat straight from a bag or box. Doing this can lead to overeating. Put the amount you would like to eat in a cup or on a plate to make sure you are eating the right portion.  Use smaller plates, glasses, and bowls to prevent overeating.  Try not to multitask (for example, watch TV or use your computer) while eating. If it is time to eat, sit down at a table and enjoy your food. This will help you to know when you are full. It will also help you to be aware of what you are eating and how much you are eating. What are tips for following this plan? Reading food labels  Check the calorie count compared to the serving size. The serving size may be smaller than what you are used to  eating.  Check the source of the calories. Make sure the food you are eating is high in vitamins and protein and low in saturated and trans fats. Shopping  Read nutrition labels while you shop. This will help you make healthy decisions before you decide to purchase your food.  Make a grocery list and stick to it. Cooking  Try to cook your favorite foods in a healthier way. For example, try baking instead of frying.  Use low-fat dairy products. Meal planning  Use more fruits and vegetables. Half of your plate should be fruits   and vegetables.  Include lean proteins like poultry and fish. How do I count calories when eating out?  Ask for smaller portion sizes.  Consider sharing an entree and sides instead of getting your own entree.  If you get your own entree, eat only half. Ask for a box at the beginning of your meal and put the rest of your entree in it so you are not tempted to eat it.  If calories are listed on the menu, choose the lower calorie options.  Choose dishes that include vegetables, fruits, whole grains, low-fat dairy products, and lean protein.  Choose items that are boiled, broiled, grilled, or steamed. Stay away from items that are buttered, battered, fried, or served with cream sauce. Items labeled "crispy" are usually fried, unless stated otherwise.  Choose water, low-fat milk, unsweetened iced tea, or other drinks without added sugar. If you want an alcoholic beverage, choose a lower calorie option such as a glass of wine or light beer.  Ask for dressings, sauces, and syrups on the side. These are usually high in calories, so you should limit the amount you eat.  If you want a salad, choose a garden salad and ask for grilled meats. Avoid extra toppings like bacon, cheese, or fried items. Ask for the dressing on the side, or ask for olive oil and vinegar or lemon to use as dressing.  Estimate how many servings of a food you are given. For example, a serving of  cooked rice is  cup or about the size of half a baseball. Knowing serving sizes will help you be aware of how much food you are eating at restaurants. The list below tells you how big or small some common portion sizes are based on everyday objects: ? 1 oz--4 stacked dice. ? 3 oz--1 deck of cards. ? 1 tsp--1 die. ? 1 Tbsp-- a ping-pong ball. ? 2 Tbsp--1 ping-pong ball. ?  cup-- baseball. ? 1 cup--1 baseball. Summary  Calorie counting means keeping track of how many calories you eat and drink each day. If you eat fewer calories than your body needs, you should lose weight.  A healthy amount of weight to lose per week is usually 1-2 lb (0.5-0.9 kg). This usually means reducing your daily calorie intake by 500-750 calories.  The number of calories in a food can be found on a Nutrition Facts label. If a food does not have a Nutrition Facts label, try to look up the calories online or ask your dietitian for help.  Use your calories on foods and drinks that will fill you up, and not on foods and drinks that will leave you hungry.  Use smaller plates, glasses, and bowls to prevent overeating. This information is not intended to replace advice given to you by your health care provider. Make sure you discuss any questions you have with your health care provider. Document Revised: 04/03/2018 Document Reviewed: 06/14/2016 Elsevier Patient Education  2020 [employer]. Exercising to Lose Weight Exercise is structured, repetitive physical activity to improve fitness and health. Getting regular exercise is important for everyone. It is especially important if you are overweight. Being overweight increases your risk of heart disease, stroke, diabetes, high blood pressure, and several types of cancer. Reducing your calorie intake and exercising can help you lose weight. Exercise is usually categorized as moderate or vigorous intensity. To lose weight, most people need to do a certain amount of  moderate-intensity or vigorous-intensity exercise each week. Moderate-intensity exercise  Moderate-intensity exercise  is any activity that gets you moving enough to burn at least three times more energy (calories) than if you were sitting. Examples of moderate exercise include:  Walking a mile in 15 minutes.  Doing light yard work.  Biking at an easy pace. Most people should get at least 150 minutes (2 hours and 30 minutes) a week of moderate-intensity exercise to maintain their body weight. Vigorous-intensity exercise Vigorous-intensity exercise is any activity that gets you moving enough to burn at least six times more calories than if you were sitting. When you exercise at this intensity, you should be working hard enough that you are not able to carry on a conversation. Examples of vigorous exercise include:  Running.  Playing a team sport, such as football, basketball, and soccer.  Jumping rope. Most people should get at least 75 minutes (1 hour and 15 minutes) a week of vigorous-intensity exercise to maintain their body weight. How can exercise affect me? When you exercise enough to burn more calories than you eat, you lose weight. Exercise also reduces body fat and builds muscle. The more muscle you have, the more calories you burn. Exercise also:  Improves mood.  Reduces stress and tension.  Improves your overall fitness, flexibility, and endurance.  Increases bone strength. The amount of exercise you need to lose weight depends on:  Your age.  The type of exercise.  Any health conditions you have.  Your overall physical ability. Talk to your health care provider about how much exercise you need and what types of activities are safe for you. What actions can I take to lose weight? Nutrition   Make changes to your diet as told by your health care provider or diet and nutrition specialist (dietitian). This may include: ? Eating fewer calories. ? Eating more  protein. ? Eating less unhealthy fats. ? Eating a diet that includes fresh fruits and vegetables, whole grains, low-fat dairy products, and lean protein. ? Avoiding foods with added fat, salt, and sugar.  Drink plenty of water while you exercise to prevent dehydration or heat stroke. Activity  Choose an activity that you enjoy and set realistic goals. Your health care provider can help you make an exercise plan that works for you.  Exercise at a moderate or vigorous intensity most days of the week. ? The intensity of exercise may vary from person to person. You can tell how intense a workout is for you by paying attention to your breathing and heartbeat. Most people will notice their breathing and heartbeat get faster with more intense exercise.  Do resistance training twice each week, such as: ? Push-ups. ? Sit-ups. ? Lifting weights. ? Using resistance bands.  Getting short amounts of exercise can be just as helpful as long structured periods of exercise. If you have trouble finding time to exercise, try to include exercise in your daily routine. ? Get up, stretch, and walk around every 30 minutes throughout the day. ? Go for a walk during your lunch break. ? Park your car farther away from your destination. ? If you take public transportation, get off one stop early and walk the rest of the way. ? Make phone calls while standing up and walking around. ? Take the stairs instead of elevators or escalators.  Wear comfortable clothes and shoes with good support.  Do not exercise so much that you hurt yourself, feel dizzy, or get very short of breath. Where to find more information  [place] Department of Health and Human Services:  [employer]  Centers for Disease Control and Prevention (CDC): http://www.wolf.info/ Contact a health care provider:  Before starting a new exercise program.  If you have questions or concerns about your weight.  If you have a medical problem that keeps you from  exercising. Get help right away if you have any of the following while exercising:  Injury.  Dizziness.  Difficulty breathing or shortness of breath that does not go away when you stop exercising.  Chest pain.  Rapid heartbeat. Summary  Being overweight increases your risk of heart disease, stroke, diabetes, high blood pressure, and several types of cancer.  Losing weight happens when you burn more calories than you eat.  Reducing the amount of calories you eat in addition to getting regular moderate or vigorous exercise each week helps you lose weight. This information is not intended to replace advice given to you by your health care provider. Make sure you discuss any questions you have with your health care provider. Document Revised: 07/28/2017 Document Reviewed: 07/28/2017 Elsevier Patient Education  2020 [place] Maintenance, Female Adopting a healthy lifestyle and getting preventive care are important in promoting health and wellness. Ask your health care provider about:  The right schedule for you to have regular tests and exams.  Things you can do on your own to prevent diseases and keep yourself healthy. What should I know about diet, weight, and exercise? Eat a healthy diet   Eat a diet that includes plenty of vegetables, fruits, low-fat dairy products, and lean protein.  Do not eat a lot of foods that are high in solid fats, added sugars, or sodium. Maintain a healthy weight Body mass index (BMI) is used to identify weight problems. It estimates body fat based on height and weight. Your health care provider can help determine your BMI and help you achieve or maintain a healthy weight. Get regular exercise Get regular exercise. This is one of the most important things you can do for your health. Most adults should:  Exercise for at least 150 minutes each week. The exercise should increase your heart rate and make you sweat (moderate-intensity  exercise).  Do strengthening exercises at least twice a week. This is in addition to the moderate-intensity exercise.  Spend less time sitting. Even light physical activity can be beneficial. Watch cholesterol and blood lipids Have your blood tested for lipids and cholesterol at 28 years of age, then have this test every 5 years. Have your cholesterol levels checked more often if:  Your lipid or cholesterol levels are high.  You are older than 29 years of age.  You are at high risk for heart disease. What should I know about cancer screening? Depending on your health history and family history, you may need to have cancer screening at various ages. This may include screening for:  Breast cancer.  Cervical cancer.  Colorectal cancer.  Skin cancer.  Lung cancer. What should I know about heart disease, diabetes, and high blood pressure? Blood pressure and heart disease  High blood pressure causes heart disease and increases the risk of stroke. This is more likely to develop in people who have high blood pressure readings, are of African descent, or are overweight.  Have your blood pressure checked: ? Every 3-5 years if you are 1-88 years of age. ? Every year if you are 75 years old or older. Diabetes Have regular diabetes screenings. This checks your fasting blood sugar level. Have the screening done:  Once every three years  after age 25 if you are at a normal weight and have a low risk for diabetes.  More often and at a younger age if you are overweight or have a high risk for diabetes. What should I know about preventing infection? Hepatitis B If you have a higher risk for hepatitis B, you should be screened for this virus. Talk with your health care provider to find out if you are at risk for hepatitis B infection. Hepatitis C Testing is recommended for:  Everyone born from [birth year] through 1965.  Anyone with known risk factors for hepatitis C. Sexually transmitted  infections (STIs)  Get screened for STIs, including gonorrhea and chlamydia, if: ? You are sexually active and are younger than 29 years of age. ? You are older than 29 years of age and your health care provider tells you that you are at risk for this type of infection. ? Your sexual activity has changed since you were last screened, and you are at increased risk for chlamydia or gonorrhea. Ask your health care provider if you are at risk.  Ask your health care provider about whether you are at high risk for HIV. Your health care provider may recommend a prescription medicine to help prevent HIV infection. If you choose to take medicine to prevent HIV, you should first get tested for HIV. You should then be tested every 3 months for as long as you are taking the medicine. Pregnancy  If you are about to stop having your period (premenopausal) and you may become pregnant, seek counseling before you get pregnant.  Take 400 to 800 micrograms (mcg) of folic acid every day if you become pregnant.  Ask for birth control (contraception) if you want to prevent pregnancy. Osteoporosis and menopause Osteoporosis is a disease in which the bones lose minerals and strength with aging. This can result in bone fractures. If you are 8 years old or older, or if you are at risk for osteoporosis and fractures, ask your health care provider if you should:  Be screened for bone loss.  Take a calcium or vitamin D supplement to lower your risk of fractures.  Be given hormone replacement therapy (HRT) to treat symptoms of menopause. Follow these instructions at home: Lifestyle  Do not use any products that contain nicotine or tobacco, such as cigarettes, e-cigarettes, and chewing tobacco. If you need help quitting, ask your health care provider.  Do not use street drugs.  Do not share needles.  Ask your health care provider for help if you need support or information about quitting drugs. Alcohol use  Do  not drink alcohol if: ? Your health care provider tells you not to drink. ? You are pregnant, may be pregnant, or are planning to become pregnant.  If you drink alcohol: ? Limit how much you use to 0-1 drink a day. ? Limit intake if you are breastfeeding.  Be aware of how much alcohol is in your drink. In the [place] one drink equals one 12 oz bottle of beer (355 mL), one 5 oz glass of wine (148 mL), or one 1 oz glass of hard liquor (44 mL). General instructions  Schedule regular health, dental, and eye exams.  Stay current with your vaccines.  Tell your health care provider if: ? You often feel depressed. ? You have ever been abused or do not feel safe at home. Summary  Adopting a healthy lifestyle and getting preventive care are important in promoting health and wellness.  Follow  your health care provider's instructions about healthy diet, exercising, and getting tested or screened for diseases.  Follow your health care provider's instructions on monitoring your cholesterol and blood pressure. This information is not intended to replace advice given to you by your health care provider. Make sure you discuss any questions you have with your health care provider. Document Revised: 07/08/2018 Document Reviewed: 07/08/2018 Elsevier Patient Education  [place]. Bupropion extended-release tablets (Depression/Mood Disorders) What is this medicine? BUPROPION (byoo PROE pee on) is used to treat depression. This medicine may be used for other purposes; ask your health care provider or pharmacist if you have questions. COMMON BRAND NAME(S): Aplenzin, Budeprion XL, Forfivo XL, Wellbutrin XL What should I tell my health care provider before I take this medicine? They need to know if you have any of these conditions:  an eating disorder, such as anorexia or bulimia  bipolar disorder or psychosis  diabetes or high blood sugar, treated with medication  glaucoma  head injury or  brain tumor  heart disease, previous heart attack, or irregular heart beat  high blood pressure  kidney or liver disease  seizures (convulsions)  suicidal thoughts or a previous suicide attempt  Tourette's syndrome  weight loss  an unusual or allergic reaction to bupropion, other medicines, foods, dyes, or preservatives  breast-feeding  pregnant or trying to become pregnant How should I use this medicine? Take this medicine by mouth with a glass of water. Follow the directions on the prescription label. You can take it with or without food. If it upsets your stomach, take it with food. Do not crush, chew, or cut these tablets. This medicine is taken once daily at the same time each day. Do not take your medicine more often than directed. Do not stop taking this medicine suddenly except upon the advice of your doctor. Stopping this medicine too quickly may cause serious side effects or your condition may worsen. A special MedGuide will be given to you by the pharmacist with each prescription and refill. Be sure to read this information carefully each time. Talk to your pediatrician regarding the use of this medicine in children. Special care may be needed. Overdosage: If you think you have taken too much of this medicine contact a poison control center or emergency room at once. NOTE: This medicine is only for you. Do not share this medicine with others. What if I miss a dose? If you miss a dose, skip the missed dose and take your next tablet at the regular time. Do not take double or extra doses. What may interact with this medicine? Do not take this medicine with any of the following medications:  linezolid  MAOIs like Azilect, Carbex, Eldepryl, Marplan, Nardil, and Parnate  methylene blue (injected into a vein)  other medicines that contain bupropion like Zyban This medicine may also interact with the following medications:  alcohol  certain medicines for anxiety or  sleep  certain medicines for blood pressure like metoprolol, propranolol  certain medicines for depression or psychotic disturbances  certain medicines for HIV or AIDS like efavirenz, lopinavir, nelfinavir, ritonavir  certain medicines for irregular heart beat like propafenone, flecainide  certain medicines for Parkinson's disease like amantadine, levodopa  certain medicines for seizures like carbamazepine, phenytoin, phenobarbital  cimetidine  clopidogrel  cyclophosphamide  digoxin  furazolidone  isoniazid  nicotine  orphenadrine  procarbazine  steroid medicines like prednisone or cortisone  stimulant medicines for attention disorders, weight loss, or to stay awake  tamoxifen  theophylline  thiotepa  ticlopidine  tramadol  warfarin This list may not describe all possible interactions. Give your health care provider a list of all the medicines, herbs, non-prescription drugs, or dietary supplements you use. Also tell them if you smoke, drink alcohol, or use illegal drugs. Some items may interact with your medicine. What should I watch for while using this medicine? Tell your doctor if your symptoms do not get better or if they get worse. Visit your doctor or healthcare provider for regular checks on your progress. Because it may take several weeks to see the full effects of this medicine, it is important to continue your treatment as prescribed by your doctor. This medicine may cause serious skin reactions. They can happen weeks to months after starting the medicine. Contact your healthcare provider right away if you notice fevers or flu-like symptoms with a rash. The rash may be red or purple and then turn into blisters or peeling of the skin. Or, you might notice a red rash with swelling of the face, lips or lymph nodes in your neck or under your arms. Patients and their families should watch out for new or worsening thoughts of suicide or depression. Also watch  out for sudden changes in feelings such as feeling anxious, agitated, panicky, irritable, hostile, aggressive, impulsive, severely restless, overly excited and hyperactive, or not being able to sleep. If this happens, especially at the beginning of treatment or after a change in dose, call your healthcare provider. Avoid alcoholic drinks while taking this medicine. Drinking large amounts of alcoholic beverages, using sleeping or anxiety medicines, or quickly stopping the use of these agents while taking this medicine may increase your risk for a seizure. Do not drive or use heavy machinery until you know how this medicine affects you. This medicine can impair your ability to perform these tasks. Do not take this medicine close to bedtime. It may prevent you from sleeping. Your mouth may get dry. Chewing sugarless gum or sucking hard candy, and drinking plenty of water may help. Contact your doctor if the problem does not go away or is severe. The tablet shell for some brands of this medicine does not dissolve. This is normal. The tablet shell may appear whole in the stool. This is not a cause for concern. What side effects may I notice from receiving this medicine? Side effects that you should report to your doctor or health care professional as soon as possible:  allergic reactions like skin rash, itching or hives, swelling of the face, lips, or tongue  breathing problems  changes in vision  confusion  elevated mood, decreased need for sleep, racing thoughts, impulsive behavior  fast or irregular heartbeat  hallucinations, loss of contact with reality  increased blood pressure  rash, fever, and swollen lymph nodes  redness, blistering, peeling or loosening of the skin, including inside the mouth  seizures  suicidal thoughts or other mood changes  unusually weak or tired  vomiting Side effects that usually do not require medical attention (report to your doctor or health care  professional if they continue or are bothersome):  constipation  headache  loss of appetite  nausea  tremors  weight loss This list may not describe all possible side effects. Call your doctor for medical advice about side effects. You may report side effects to FDA at 1-800-FDA-1088. Where should I keep my medicine? Keep out of the reach of children. Store at room temperature between 15 and 30 degrees  C (59 and 86 degrees F). Throw away any unused medicine after the expiration date. NOTE: This sheet is a summary. It may not cover all possible information. If you have questions about this medicine, talk to your doctor, pharmacist, or health care provider.  2020 Elsevier/Gold Standard (2018-10-08 13:45:31)
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
## 2020-07-06 ENCOUNTER — Telehealth: Payer: Self-pay

## 2020-07-06 DIAGNOSIS — M545 Low back pain, unspecified: Secondary | ICD-10-CM

## 2020-07-06 LAB — CBC WITH DIFFERENTIAL/PLATELET
Basophils Absolute: 0.1 10*3/uL (ref 0.0–0.2)
Basos: 1 %
EOS (ABSOLUTE): 0.3 10*3/uL (ref 0.0–0.4)
Eos: 4 %
Hematocrit: 38.4 % (ref 34.0–46.6)
Hemoglobin: 12.8 g/dL (ref 11.1–15.9)
Immature Grans (Abs): 0 10*3/uL (ref 0.0–0.1)
Immature Granulocytes: 0 %
Lymphocytes Absolute: 2.7 10*3/uL (ref 0.7–3.1)
Lymphs: 39 %
MCH: 29 pg (ref 26.6–33.0)
MCHC: 33.3 g/dL (ref 31.5–35.7)
MCV: 87 fL (ref 79–97)
Monocytes Absolute: 0.4 10*3/uL (ref 0.1–0.9)
Monocytes: 6 %
Neutrophils Absolute: 3.4 10*3/uL (ref 1.4–7.0)
Neutrophils: 50 %
Platelets: 306 10*3/uL (ref 150–450)
RBC: 4.42 x10E6/uL (ref 3.77–5.28)
RDW: 13.1 % (ref 11.7–15.4)
WBC: 6.9 10*3/uL (ref 3.4–10.8)

## 2020-07-06 LAB — LIPID PANEL
Chol/HDL Ratio: 5 ratio — ABNORMAL HIGH (ref 0.0–4.4)
Cholesterol, Total: 184 mg/dL (ref 100–199)
HDL: 37 mg/dL — ABNORMAL LOW (ref >39)
LDL Chol Calc (NIH): 134 mg/dL — ABNORMAL HIGH (ref 0–99)
Triglycerides: 68 mg/dL (ref 0–149)
VLDL Cholesterol Cal: 13 mg/dL (ref 5–40)

## 2020-07-06 LAB — COMPREHENSIVE METABOLIC PANEL
ALT: 21 IU/L (ref 0–32)
AST: 17 IU/L (ref 0–40)
Albumin/Globulin Ratio: 1.6 (ref 1.2–2.2)
Albumin: 4.2 g/dL (ref 3.9–5.0)
Alkaline Phosphatase: 74 IU/L (ref 44–121)
BUN/Creatinine Ratio: 11 (ref 9–23)
BUN: 10 mg/dL (ref 6–20)
Bilirubin Total: 0.4 mg/dL (ref 0.0–1.2)
CO2: 24 mmol/L (ref 20–29)
Calcium: 9.4 mg/dL (ref 8.7–10.2)
Chloride: 102 mmol/L (ref 96–106)
Creatinine, Ser: 0.88 mg/dL (ref 0.57–1.00)
GFR calc Af Amer: 103 mL/min/{1.73_m2} (ref >59)
GFR calc non Af Amer: 89 mL/min/{1.73_m2} (ref >59)
Globulin, Total: 2.7 g/dL (ref 1.5–4.5)
Glucose: 85 mg/dL (ref 65–99)
Potassium: 4.2 mmol/L (ref 3.5–5.2)
Sodium: 139 mmol/L (ref 134–144)
Total Protein: 6.9 g/dL (ref 6.0–8.5)

## 2020-07-06 LAB — TSH: TSH: 2.61 u[IU]/mL (ref 0.450–4.500)

## 2020-07-06 LAB — VITAMIN D 25 HYDROXY (VIT D DEFICIENCY, FRACTURES): Vit D, 25-Hydroxy: 31.2 ng/mL (ref 30.0–100.0)

## 2020-07-06 MED ORDER — VITAMIN D (ERGOCALCIFEROL) 1.25 MG (50000 UNIT) PO CAPS: 50000.0000 [IU] | ORAL_CAPSULE | ORAL | 0 refills | Status: DC

## 2020-07-06 NOTE — Progress Notes (Signed)
CBC and CMP  within normal limits.   LDL elevated. HDL good cholesterol is low.  Discuss lifestyle modification with patient e.g. increase exercise, fiber, fruits, vegetables, lean meat, and omega 3/fish intake and decrease saturated fat.  If patient following strict diet and exercise program already please schedule follow up appointment with primary care physician  TSH for thyroid within normal limits.   Vitamin D is low end normal- will send prescription vitamin D to take once  weekly for 12 weeks recheck lab 1- 2 weeks after completion.

## 2020-07-06 NOTE — Telephone Encounter (Signed)
-----   Message from Minette Headland, [place] sent at 07/06/2020  9:17 AM EST ----- Lmtcb okay for [facility] triage to advise patient. KW

## 2020-07-06 NOTE — Telephone Encounter (Signed)
Marijo Conception, RN  07/06/2020 10:06 AM EST Back to Top     Pt called in and was given the message from Laverna Peace, [place] dated 07/06/2020 at 8:38 am for lumbar spine x ray. She does want a orthopedic referral.

## 2020-07-06 NOTE — Progress Notes (Signed)
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

## 2020-07-15 DIAGNOSIS — F331 Major depressive disorder, recurrent, moderate: Secondary | ICD-10-CM | POA: Diagnosis not present

## 2020-07-18 DIAGNOSIS — M5416 Radiculopathy, lumbar region: Secondary | ICD-10-CM | POA: Diagnosis not present

## 2020-07-20 DIAGNOSIS — F331 Major depressive disorder, recurrent, moderate: Secondary | ICD-10-CM | POA: Diagnosis not present

## 2020-07-24 DIAGNOSIS — F331 Major depressive disorder, recurrent, moderate: Secondary | ICD-10-CM | POA: Diagnosis not present

## 2020-07-25 DIAGNOSIS — H5211 Myopia, right eye: Secondary | ICD-10-CM | POA: Diagnosis not present

## 2020-07-29 DIAGNOSIS — Z419 Encounter for procedure for purposes other than remedying health state, unspecified: Secondary | ICD-10-CM | POA: Diagnosis not present

## 2020-08-04 ENCOUNTER — Telehealth: Payer: Self-pay

## 2020-08-04 NOTE — Telephone Encounter (Signed)
Copied from [place] [id number]. Topic: General - Other >> Aug 04, 2020  3:03 PM Celene Kras wrote: Reason for CRM: Pt called and is requesting to have a letter written stating that it would be beneficial for her to have bariatric surgery. Please advise.

## 2020-08-05 DIAGNOSIS — M5416 Radiculopathy, lumbar region: Secondary | ICD-10-CM | POA: Diagnosis not present

## 2020-08-05 DIAGNOSIS — M545 Low back pain, unspecified: Secondary | ICD-10-CM | POA: Diagnosis not present

## 2020-08-07 NOTE — Telephone Encounter (Signed)
Patient states that she has been reached out by bariatric center and has a upcoming appt. Denise Contreras

## 2020-08-08 ENCOUNTER — Ambulatory Visit: Payer: Self-pay | Admitting: Adult Health

## 2020-08-08 DIAGNOSIS — F331 Major depressive disorder, recurrent, moderate: Secondary | ICD-10-CM | POA: Diagnosis not present

## 2020-08-09 DIAGNOSIS — F331 Major depressive disorder, recurrent, moderate: Secondary | ICD-10-CM | POA: Diagnosis not present

## 2020-08-29 ENCOUNTER — Ambulatory Visit (INDEPENDENT_AMBULATORY_CARE_PROVIDER_SITE_OTHER): Payer: Medicaid Other | Admitting: Adult Health

## 2020-08-29 DIAGNOSIS — Z5329 Procedure and treatment not carried out because of patient's decision for other reasons: Secondary | ICD-10-CM

## 2020-08-29 DIAGNOSIS — Z419 Encounter for procedure for purposes other than remedying health state, unspecified: Secondary | ICD-10-CM | POA: Diagnosis not present

## 2020-08-29 NOTE — Progress Notes (Signed)
   Complete physical exam  Patient: Denise Contreras   DOB: 05/18/1999   30 y.o. Female  MRN: [id number]  Subjective:    No chief complaint on file.   Denise Contreras is a 30 y.o. female who presents today for a complete physical exam. She reports consuming a {diet types:17450} diet. {types:19826} She generally feels {DESC; WELL/FAIRLY WELL/POORLY:18703}. She reports sleeping {DESC; WELL/FAIRLY WELL/POORLY:18703}. She {does/does not:200015} have additional problems to discuss today.    Most recent fall risk assessment:    01/23/2022   10:42 AM  Fall Risk   Falls in the past year? 0  Number falls in past yr: 0  Injury with Fall? 0  Risk for fall due to : No Fall Risks  Follow up Falls evaluation completed     Most recent depression screenings:    01/23/2022   10:42 AM 12/14/2020   10:46 AM  PHQ 2/9 Scores  PHQ - 2 Score 0 0  PHQ- 9 Score 5     {VISON DENTAL STD PSA (Optional):27386}  {History (Optional):23778}  Patient Care Team: Jessup, Joy, NP as PCP - General (Nurse Practitioner)   Outpatient Medications Prior to Visit  Medication Sig   fluticasone (FLONASE) 50 MCG/ACT nasal spray Place 2 sprays into both nostrils in the morning and at bedtime. After 7 days, reduce to once daily.   norgestimate-ethinyl estradiol (SPRINTEC 28) 0.25-35 MG-MCG tablet Take 1 tablet by mouth daily.   Nystatin POWD Apply liberally to affected area 2 times per day   spironolactone (ALDACTONE) 100 MG tablet Take 1 tablet (100 mg total) by mouth daily.   No facility-administered medications prior to visit.    ROS        Objective:     There were no vitals taken for this visit. {Vitals History (Optional):23777}  Physical Exam   No results found for any visits on 02/28/22. {Show previous labs (optional):23779}    Assessment & Plan:    Routine Health Maintenance and Physical Exam  Immunization History  Administered Date(s) Administered   DTaP 08/01/1999, 09/27/1999,  12/06/1999, 08/21/2000, 03/06/2004   Hepatitis A 01/01/2008, 01/06/2009   Hepatitis B 05/19/1999, 06/26/1999, 12/06/1999   HiB (PRP-OMP) 08/01/1999, 09/27/1999, 12/06/1999, 08/21/2000   IPV 08/01/1999, 09/27/1999, 05/26/2000, 03/06/2004   Influenza,inj,Quad PF,6+ Mos 04/08/2014   Influenza-Unspecified 07/08/2012   MMR 05/26/2001, 03/06/2004   Meningococcal Polysaccharide 01/06/2012   Pneumococcal Conjugate-13 08/21/2000   Pneumococcal-Unspecified 12/06/1999, 02/19/2000   Tdap 01/06/2012   Varicella 05/26/2000, 01/01/2008    Health Maintenance  Topic Date Due   HIV Screening  Never done   Hepatitis C Screening  Never done   INFLUENZA VACCINE  02/26/2022   PAP-Cervical Cytology Screening  02/28/2022 (Originally 05/17/2020)   PAP SMEAR-Modifier  02/28/2022 (Originally 05/17/2020)   TETANUS/TDAP  02/28/2022 (Originally 01/05/2022)   HPV VACCINES  Discontinued   COVID-19 Vaccine  Discontinued    Discussed health benefits of physical activity, and encouraged her to engage in regular exercise appropriate for her age and condition.  Problem List Items Addressed This Visit   None Visit Diagnoses     Annual physical exam    -  Primary   Cervical cancer screening       Need for Tdap vaccination          No follow-ups on file.     Joy Jessup, NP   

## 2020-08-31 ENCOUNTER — Ambulatory Visit: Payer: Medicaid Other | Admitting: Adult Health

## 2020-09-04 NOTE — Progress Notes (Deleted)
      Established patient visit   Patient: Denise Contreras   DOB: Jun 22, 1991   30 y.o. Female  MRN: [id number] Visit Date: 09/05/2020  Today's healthcare provider: Marcille Buffy, FNP   No chief complaint on file.  Subjective    HPI  Depression, Follow-up  She  was last seen for this 2 months ago. Changes made at last visit include patient was started on Wellbutrin 300mg  .   She reports {excellent/good/fair/poor:19665} compliance with treatment. She {is/is not:21021397} having side effects. ***  She reports {DESC; GOOD/FAIR/POOR:18685} tolerance of treatment. Current symptoms include: {Symptoms; depression:1002} She feels she is {improved/worse/unchanged:3041574} since last visit.  Depression screen PHQ 2/9 07/05/2020  Decreased Interest 1  Down, Depressed, Hopeless 1  PHQ - 2 Score 2  Altered sleeping 2  Tired, decreased energy 3  Change in appetite 3  Feeling bad or failure about yourself  3  Trouble concentrating 2  Moving slowly or fidgety/restless 2  Suicidal thoughts 0  PHQ-9 Score 17  Difficult doing work/chores Somewhat difficult    -----------------------------------------------------------------------------------------  {Show patient history (optional):23778::" "}   Medications: Outpatient Medications Prior to Visit  Medication Sig  . albuterol (VENTOLIN HFA) 108 (90 Base) MCG/ACT inhaler Inhale 1-2 puffs into the lungs every 6 (six) hours as needed for wheezing or shortness of breath.  Marland Kitchen buPROPion (WELLBUTRIN XL) 300 MG 24 hr tablet Take 1 tablet (300 mg total) by mouth daily.  Marland Kitchen ipratropium-albuterol (DUONEB) 0.5-2.5 (3) MG/3ML SOLN Take 3 mLs by nebulization every 6 (six) hours as needed.  Marland Kitchen lisinopril (ZESTRIL) 20 MG tablet Take 1 tablet (20 mg total) by mouth daily.  Marland Kitchen loratadine (CLARITIN) 10 MG tablet Take 1 tablet (10 mg total) by mouth daily.  . Multiple Vitamin (MULTIVITAMIN) tablet Take 1 tablet by mouth daily.  . Vitamin D,  Ergocalciferol, (DRISDOL) 1.25 MG (50000 UNIT) CAPS capsule Take 1 capsule (50,000 Units total) by mouth every 7 (seven) days. (taking one tablet per week) walk in lab in office 1-2 weeks after completing prescription. Discontinue over the counter vitamin D while taking this.   No facility-administered medications prior to visit.    Review of Systems  {Labs  Heme  Chem  Endocrine  Serology  Results Review (optional):23779::" "}   Objective    There were no vitals taken for this visit. {Show previous vital signs (optional):23777::" "}   Physical Exam  ***  No results found for any visits on 09/05/20.  Assessment & Plan     ***  No follow-ups on file.      {provider attestation***:1}   Marcille Buffy, [place] [phone] (phone) [phone] (fax)  [place]

## 2020-09-05 ENCOUNTER — Ambulatory Visit: Payer: Medicaid Other | Admitting: Adult Health

## 2020-09-13 ENCOUNTER — Ambulatory Visit: Payer: Medicaid Other | Admitting: Adult Health

## 2020-09-13 ENCOUNTER — Other Ambulatory Visit: Payer: Self-pay

## 2020-09-26 DIAGNOSIS — Z419 Encounter for procedure for purposes other than remedying health state, unspecified: Secondary | ICD-10-CM | POA: Diagnosis not present

## 2020-09-27 DIAGNOSIS — Z6841 Body Mass Index (BMI) 40.0 and over, adult: Secondary | ICD-10-CM | POA: Diagnosis not present

## 2020-09-27 DIAGNOSIS — K219 Gastro-esophageal reflux disease without esophagitis: Secondary | ICD-10-CM | POA: Diagnosis not present

## 2020-09-29 ENCOUNTER — Other Ambulatory Visit: Payer: Self-pay | Admitting: Surgical Oncology

## 2020-09-29 DIAGNOSIS — K219 Gastro-esophageal reflux disease without esophagitis: Secondary | ICD-10-CM

## 2020-10-03 ENCOUNTER — Encounter: Payer: Self-pay | Admitting: Adult Health

## 2020-10-03 ENCOUNTER — Ambulatory Visit (INDEPENDENT_AMBULATORY_CARE_PROVIDER_SITE_OTHER): Payer: Medicaid Other | Admitting: Adult Health

## 2020-10-03 ENCOUNTER — Ambulatory Visit
Admission: RE | Admit: 2020-10-03 | Discharge: 2020-10-03 | Disposition: A | Discharge status: Home / Self Care | Payer: Medicaid Other | Source: Ambulatory Visit | Attending: Adult Health | Admitting: Adult Health

## 2020-10-03 ENCOUNTER — Other Ambulatory Visit: Payer: Self-pay

## 2020-10-03 VITALS — BP 121/80 | HR 82 | Resp 16 | Wt 340.8 lb

## 2020-10-03 DIAGNOSIS — E559 Vitamin D deficiency, unspecified: Secondary | ICD-10-CM

## 2020-10-03 DIAGNOSIS — M7731 Calcaneal spur, right foot: Secondary | ICD-10-CM | POA: Diagnosis not present

## 2020-10-03 DIAGNOSIS — N926 Irregular menstruation, unspecified: Secondary | ICD-10-CM

## 2020-10-03 DIAGNOSIS — M79672 Pain in left foot: Secondary | ICD-10-CM

## 2020-10-03 DIAGNOSIS — M79671 Pain in right foot: Secondary | ICD-10-CM | POA: Diagnosis not present

## 2020-10-03 DIAGNOSIS — M2012 Hallux valgus (acquired), left foot: Secondary | ICD-10-CM | POA: Diagnosis not present

## 2020-10-03 DIAGNOSIS — M7732 Calcaneal spur, left foot: Secondary | ICD-10-CM | POA: Diagnosis not present

## 2020-10-03 MED ORDER — LISINOPRIL 20 MG PO TABS: 20.0000 mg | ORAL_TABLET | Freq: Every day | ORAL | 1 refills | Status: DC

## 2020-10-03 MED ORDER — ALBUTEROL SULFATE HFA 108 (90 BASE) MCG/ACT IN AERS
1.0000 | INHALATION_SPRAY | Freq: Four times a day (QID) | RESPIRATORY_TRACT | 0 refills | Status: DC | PRN
Start: 2020-10-03 — End: 2021-02-02

## 2020-10-03 MED ORDER — BUPROPION HCL ER (SR) 150 MG PO TB12: 150.0000 mg | ORAL_TABLET | Freq: Two times a day (BID) | ORAL | 0 refills | Status: DC

## 2020-10-03 MED ORDER — CETIRIZINE HCL 10 MG PO TABS: 10.0000 mg | ORAL_TABLET | Freq: Every day | ORAL | 11 refills | Status: DC

## 2020-10-03 NOTE — Progress Notes (Signed)
Established patient visit   Patient: Denise Contreras   DOB: Mar 08, 1991   30 y.o. Female  MRN: [id number] Visit Date: 10/03/2020  Today's healthcare provider: Marcille Buffy, FNP   Chief Complaint  Patient presents with  . Menstrual Problem    Patient comes in office today with concern of irregular menstrual cycle for the past 2 months. Patient states that she has been having two menstural cycles. LMP was 09/28/20 ago according to patient and lasted 3-4 days.   . Foot Pain    Patient would like to discuss heel pain for over a year, she states that she has tried compression stockings but has had no relief.   . Depression    Patient would like to address change in medication, patient reports that she was prescribed Wellbutrin and states that insurance would not cover medication. Patient reports that she feels more "snappy" and has been experiencing fatigue.    Subjective    HPI HPI    Menstrual Problem    Comments: Patient comes in office today with concern of irregular menstrual cycle for the past 2 months. Patient states that she has been having two menstural cycles. LMP was 09/28/20 ago according to patient and lasted 3-4 days. She reports lasts shorter 2- 3 days.She has around 2 weeks between cycles. Denies any heavy bleeding. Denies any vaginal discharge. PAP may be over three years. Denies any STD concerns or any new partners.  She has had this for two months.  Denies any cramping.  No pain with tampon use.           Foot Pain    Comments: Patient would like to discuss heel pain for over a year, she states that she has tried compression stockings but has had no relief. She notices when standing flat.           Depression    Comments: Patient would like to address change in medication, patient reports that she was prescribed Wellbutrin and states that insurance would not cover medication. Patient reports that she feels more "snappy" and has been experiencing  fatigue.        Last edited by Minette Headland, CMA on 10/03/2020  3:08 PM. (History)     she has seen Toney Rakes and is  Having gastric bypass  -she is in process for being approved and she has follow up in  3 months.    Patient  denies any fever, body aches,chills, rash, chest pain, shortness of breath, nausea, vomiting, or diarrhea.  Denies dizziness, lightheadedness, pre syncopal or syncopal episodes.      Medications: Outpatient Medications Prior to Visit  Medication Sig  . Biotin 5 MG TBDP   . ipratropium-albuterol (DUONEB) 0.5-2.5 (3) MG/3ML SOLN Take 3 mLs by nebulization every 6 (six) hours as needed.  . Multiple Vitamin (MULTIVITAMIN) tablet Take 1 tablet by mouth daily.  . Vitamin D, Ergocalciferol, (DRISDOL) 1.25 MG (50000 UNIT) CAPS capsule Take 1 capsule (50,000 Units total) by mouth every 7 (seven) days. (taking one tablet per week) walk in lab in office 1-2 weeks after completing prescription. Discontinue over the counter vitamin D while taking this.  . [DISCONTINUED] albuterol (VENTOLIN HFA) 108 (90 Base) MCG/ACT inhaler Inhale 1-2 puffs into the lungs every 6 (six) hours as needed for wheezing or shortness of breath.  . [DISCONTINUED] lisinopril (ZESTRIL) 20 MG tablet Take 1 tablet (20 mg total) by mouth daily.  . [DISCONTINUED] loratadine (CLARITIN) 10 MG tablet  Take 1 tablet (10 mg total) by mouth daily.  . [DISCONTINUED] buPROPion (WELLBUTRIN XL) 300 MG 24 hr tablet Take 1 tablet (300 mg total) by mouth daily.   No facility-administered medications prior to visit.    Review of Systems  Constitutional: Negative.   HENT: Negative.   Respiratory: Negative.   Cardiovascular: Negative.   Gastrointestinal: Negative.   Genitourinary: Positive for menstrual problem.  Musculoskeletal: Positive for arthralgias.  Psychiatric/Behavioral: Negative.   {Labs  Heme  Chem  Endocrine  Serology  Results Review      Objective    BP 121/80   Pulse 82   Resp 16   Wt  (!) 340 lb 12.8 oz (154.6 kg)   LMP 09/27/2020 (Exact Date)   SpO2 98%   BMI 60.37 kg/m  BP Readings from Last 3 Encounters:  10/03/20 121/80  07/05/20 (!) 144/87  06/02/19 (!) 145/93   Wt Readings from Last 3 Encounters:  10/03/20 (!) 340 lb 12.8 oz (154.6 kg)  07/05/20 (!) 347 lb 12.8 oz (157.8 kg)  06/02/19 (!) 327 lb (148.3 kg)       Physical Exam Vitals reviewed.  Constitutional:      General: She is not in acute distress.    Appearance: She is well-developed. She is obese. She is not ill-appearing, toxic-appearing or diaphoretic.     Interventions: She is not intubated. HENT:     Head: Normocephalic and atraumatic.     Right Ear: Tympanic membrane, ear canal and external ear normal. There is no impacted cerumen.     Left Ear: Tympanic membrane, ear canal and external ear normal. There is no impacted cerumen.     Nose: Nose normal. No congestion or rhinorrhea.     Mouth/Throat:     Mouth: Mucous membranes are moist.     Pharynx: No oropharyngeal exudate or posterior oropharyngeal erythema.  Eyes:     General: Lids are normal. No scleral icterus.       Right eye: No discharge.        Left eye: No discharge.     Extraocular Movements: Extraocular movements intact.     Conjunctiva/sclera: Conjunctivae normal.     Right eye: Right conjunctiva is not injected. No exudate or hemorrhage.    Left eye: Left conjunctiva is not injected. No exudate or hemorrhage.    Pupils: Pupils are equal, round, and reactive to light.  Neck:     Thyroid: No thyroid mass or thyromegaly.     Vascular: Normal carotid pulses. No carotid bruit, hepatojugular reflux or JVD.     Trachea: Trachea and phonation normal. No tracheal tenderness or tracheal deviation.     Meningeal: Brudzinski's sign and Kernig's sign absent.  Cardiovascular:     Rate and Rhythm: Normal rate and regular rhythm.     Pulses: Normal pulses.          Radial pulses are 2+ on the right side and 2+ on the left side.        Dorsalis pedis pulses are 2+ on the right side and 2+ on the left side.       Posterior tibial pulses are 2+ on the right side and 2+ on the left side.     Heart sounds: Normal heart sounds, S1 normal and S2 normal. Heart sounds not distant. No murmur heard. No friction rub. No gallop.   Pulmonary:     Effort: Pulmonary effort is normal. No tachypnea, bradypnea, accessory muscle usage or respiratory distress. She  is not intubated.     Breath sounds: Normal breath sounds. No stridor. No wheezing or rales.  Chest:     Chest wall: No tenderness.  Breasts:     Right: No supraclavicular adenopathy.     Left: No supraclavicular adenopathy.    Abdominal:     General: Bowel sounds are normal. There is no distension or abdominal bruit.     Palpations: Abdomen is soft. There is no shifting dullness, fluid wave, hepatomegaly, splenomegaly, mass or pulsatile mass.     Tenderness: There is no abdominal tenderness. There is no right CVA tenderness, left CVA tenderness, guarding or rebound.     Hernia: No hernia is present.  Musculoskeletal:        General: No tenderness or deformity. Normal range of motion.     Cervical back: Full passive range of motion without pain, normal range of motion and neck supple. No edema, erythema or rigidity. No spinous process tenderness or muscular tenderness. Normal range of motion.  Lymphadenopathy:     Head:     Right side of head: No submental, submandibular, tonsillar, preauricular, posterior auricular or occipital adenopathy.     Left side of head: No submental, submandibular, tonsillar, preauricular, posterior auricular or occipital adenopathy.     Cervical: No cervical adenopathy.     Right cervical: No superficial, deep or posterior cervical adenopathy.    Left cervical: No superficial, deep or posterior cervical adenopathy.     Upper Body:     Right upper body: No supraclavicular or pectoral adenopathy.     Left upper body: No supraclavicular or pectoral  adenopathy.  Skin:    General: Skin is warm and dry.     Coloration: Skin is not pale.     Findings: No abrasion, bruising, burn, ecchymosis, erythema, lesion, petechiae or rash.     Nails: There is no clubbing.  Neurological:     Mental Status: She is alert and oriented to person, place, and time.     GCS: GCS eye subscore is 4. GCS verbal subscore is 5. GCS motor subscore is 6.     Cranial Nerves: No cranial nerve deficit.     Sensory: No sensory deficit.     Motor: No tremor, atrophy, abnormal muscle tone or seizure activity.     Coordination: Coordination normal.     Gait: Gait normal.     Deep Tendon Reflexes: Reflexes are normal and symmetric. Reflexes normal. Babinski sign absent on the right side. Babinski sign absent on the left side.     Reflex Scores:      Tricep reflexes are 2+ on the right side and 2+ on the left side.      Bicep reflexes are 2+ on the right side and 2+ on the left side.      Brachioradialis reflexes are 2+ on the right side and 2+ on the left side.      Patellar reflexes are 2+ on the right side and 2+ on the left side.      Achilles reflexes are 2+ on the right side and 2+ on the left side. Psychiatric:        Speech: Speech normal.        Behavior: Behavior normal.        Thought Content: Thought content normal.        Judgment: Judgment normal.       Results for orders placed or performed in visit on 10/03/20  CBC with Differential/Platelet  Result Value Ref Range   WBC 8.3 3.4 - 10.8 x10E3/uL   RBC 4.62 3.77 - 5.28 x10E6/uL   Hemoglobin 13.5 11.1 - 15.9 g/dL   Hematocrit 41.0 34.0 - 46.6 %   MCV 89 79 - 97 fL   MCH 29.2 26.6 - 33.0 pg   MCHC 32.9 31.5 - 35.7 g/dL   RDW 12.7 11.7 - 15.4 %   Platelets 307 150 - 450 x10E3/uL   Neutrophils 56 Not Estab. %   Lymphs 30 Not Estab. %   Monocytes 7 Not Estab. %   Eos 6 Not Estab. %   Basos 1 Not Estab. %   Neutrophils Absolute 4.7 1.4 - 7.0 x10E3/uL   Lymphocytes Absolute 2.5 0.7 - 3.1  x10E3/uL   Monocytes Absolute 0.5 0.1 - 0.9 x10E3/uL   EOS (ABSOLUTE) 0.5 (H) 0.0 - 0.4 x10E3/uL   Basophils Absolute 0.1 0.0 - 0.2 x10E3/uL   Immature Granulocytes 0 Not Estab. %   Immature Grans (Abs) 0.0 0.0 - 0.1 x10E3/uL  Comprehensive Metabolic Panel (CMET)  Result Value Ref Range   Glucose 85 65 - 99 mg/dL   BUN 11 6 - 20 mg/dL   Creatinine, Ser 0.86 0.57 - 1.00 mg/dL   eGFR 94 >59 mL/min/1.73   BUN/Creatinine Ratio 13 9 - 23   Sodium 141 134 - 144 mmol/L   Potassium 4.3 3.5 - 5.2 mmol/L   Chloride 102 96 - 106 mmol/L   CO2 23 20 - 29 mmol/L   Calcium 9.9 8.7 - 10.2 mg/dL   Total Protein 6.8 6.0 - 8.5 g/dL   Albumin 4.3 3.9 - 5.0 g/dL   Globulin, Total 2.5 1.5 - 4.5 g/dL   Albumin/Globulin Ratio 1.7 1.2 - 2.2   Bilirubin Total 0.3 0.0 - 1.2 mg/dL   Alkaline Phosphatase 70 44 - 121 IU/L   AST 18 0 - 40 IU/L   ALT 22 0 - 32 IU/L  TSH  Result Value Ref Range   TSH 3.740 0.450 - 4.500 uIU/mL  VITAMIN D 25 Hydroxy (Vit-D Deficiency, Fractures)  Result Value Ref Range   Vit D, 25-Hydroxy 29.8 (L) 30.0 - 100.0 ng/mL  FSH/LH  Result Value Ref Range   LH 11.0 mIU/mL   FSH 5.3 mIU/mL  Beta hCG quant (ref lab)  Result Value Ref Range   hCG Quant <1 mIU/mL    Assessment & Plan    Wants to talk to OBGYN for tubal. Wants to go to female. [facility]. Will place referral.    Irregular menstrual bleeding - Plan: US Pelvic Complete With Transvaginal, CBC with Differential/Platelet, Comprehensive Metabolic Panel (CMET), FSH/LH, TSH, B-HCG Quant, Ambulatory referral to Obstetrics / Gynecology  Vitamin D insufficiency - Plan: VITAMIN D 25 Hydroxy (Vit-D Deficiency, Fractures)  Heel pain, bilateral - Plan: DG Foot Complete Left, DG Foot Complete Right  Red Flags discussed. The patient was given clear instructions to go to ER or return to medical center if any red flags develop, symptoms do not improve, worsen or new problems develop. They verbalized understanding. Orders  Placed This Encounter  Procedures  . US Pelvic Complete With Transvaginal    Order Specific Question:   Reason for Exam (SYMPTOM  OR DIAGNOSIS REQUIRED)    Answer:   irregular menses.    Order Specific Question:   Preferred imaging location?    Answer:   [facility]  . DG Foot Complete Left    Order Specific Question:   Reason for Exam (SYMPTOM  OR DIAGNOSIS REQUIRED)    Answer:   foot pain in heel    Order Specific Question:   Is patient pregnant?    Answer:   No    Order Specific Question:   Preferred imaging location?    Answer:   [facility]  . DG Foot Complete Right    Order Specific Question:   Reason for Exam (SYMPTOM  OR DIAGNOSIS REQUIRED)    Answer:   heel pain.    Order Specific Question:   Is patient pregnant?    Answer:   No    Order Specific Question:   Preferred imaging location?    Answer:   [facility]  . CBC with Differential/Platelet  . Comprehensive Metabolic Panel (CMET)  . FSH/LH  . TSH  . VITAMIN D 25 Hydroxy (Vit-D Deficiency, Fractures)  . B-HCG Quant  . FSH/LH  . Beta hCG quant (ref lab)  . Ambulatory referral to Obstetrics / Gynecology    Referral Priority:   Routine    Referral Type:   Consultation    Referral Reason:   Specialty Services Required    Referred to Provider:   Malachy Mood, MD    Requested Specialty:   Obstetrics and Gynecology    Number of Visits Requested:   1   Return in about 2 weeks (around 10/17/2020), or if symptoms worsen or fail to improve, for at any time for any worsening symptoms, Go to Emergency room/ urgent care if worse.      The entirety of the information documented in the History of Present Illness, Review of Systems and Physical Exam were personally obtained by me. Portions of this information were initially documented by the CMA and reviewed by me for thoroughness and accuracy.     Marcille Buffy, [place] [phone] (phone) [phone] (fax)  [place]

## 2020-10-03 NOTE — Patient Instructions (Addendum)
With the Wellbutrin start with 150 mg - one tablet daily for the first one to two weeks and then can increase as prescribed.   Abnormal Uterine Bleeding  Abnormal uterine bleeding is unusual bleeding from the uterus. It includes bleeding after sex, or bleeding or spotting between menstrual periods. It may also include bleeding that is heavier than normal, menstrual periods that last longer than usual, or bleeding that occurs after menopause. Abnormal uterine bleeding can affect teenagers, women in their reproductive years, pregnant women, and women who have reached menopause. Common causes of abnormal uterine bleeding include:  Pregnancy.  Growths of tissue (polyps).  Benign tumors or growths in the uterus (fibroids). These are not cancer.  Infection.  Cancer.  Too much or too little of some hormones in the body (hormonal imbalances). Any type of abnormal bleeding should be checked by a health care provider. Many cases are minor and simple to treat, but others may be more serious. Treatment will depend on the cause and severity of the bleeding. Follow these instructions at home: Medicines  Take over-the-counter and prescription medicines only as told by your health care provider.  Tell your health care provider about other medicines that you take. You may be asked to stop taking aspirin or medicines that contain aspirin. These medicines can make bleeding worse.  If you were prescribed iron pills, take them as told by your health care provider. Iron pills help to replace iron that your body loses because of this condition. Managing constipation In cases of severe bleeding, you may be asked to increase your iron intake to treat anemia. This may cause constipation. To prevent or treat constipation, you may need to:  Drink enough fluid to keep your urine pale yellow.  Take over-the-counter or prescription medicines.  Eat foods that are high in fiber, such as beans, whole grains, and  fresh fruits and vegetables.  Limit foods that are high in fat and processed sugars, such as fried or sweet foods. General instructions  Monitor your condition for any changes.  Do not use tampons, douche, or have sex until your health care provider says these things are okay.  Change your pads often.  Get regular exams. This includes pelvic exams and cervical cancer screenings. ? It is up to you to get the results of any tests that are done. Ask your health care provider, or the department that is doing the tests, when your results will be ready.  Keep all follow-up visits as told by your health care provider. This is important. Contact a health care provider if you:  Have bleeding that lasts for more than 1 week.  Feel dizzy at times.  Feel nauseous or you vomit.  Feel light-headed or weak.  Notice any other changes that show that your condition is getting worse. Get help right away if you:  Pass out.  Have bleeding that soaks through a pad every hour.  Have pain in the abdomen.  Have a fever or chills.  Become sweaty or weak.  Pass large blood clots from your vagina. Summary  Abnormal uterine bleeding is unusual bleeding from the uterus.  Any type of abnormal bleeding should be evaluated by a health care provider. Many cases are minor and simple to treat, but others may be more serious.  Treatment will depend on the cause of the bleeding.  Get help right away if you pass out, you have bleeding that soaks through a pad every hour, or you pass large blood clots  from your vagina. This information is not intended to replace advice given to you by your health care provider. Make sure you discuss any questions you have with your health care provider. Document Revised: 03/22/2020 Document Reviewed: 05/18/2019 Elsevier Patient Education  2021 [place].  Bupropion tablets (Depression/Mood Disorders) What is this medicine? BUPROPION (byoo PROE pee on) is used to  treat depression. This medicine may be used for other purposes; ask your health care provider or pharmacist if you have questions. COMMON BRAND NAME(S): Wellbutrin What should I tell my health care provider before I take this medicine? They need to know if you have any of these conditions:  an eating disorder, such as anorexia or bulimia  bipolar disorder or psychosis  diabetes or high blood sugar, treated with medication  glaucoma  heart disease, previous heart attack, or irregular heart beat  head injury or brain tumor  high blood pressure  kidney or liver disease  seizures  suicidal thoughts or a previous suicide attempt  Tourette's syndrome  weight loss  an unusual or allergic reaction to bupropion, other medicines, foods, dyes, or preservatives  breast-feeding  pregnant or trying to become pregnant How should I use this medicine? Take this medicine by mouth with a glass of water. Follow the directions on the prescription label. You can take it with or without food. If it upsets your stomach, take it with food. Take your medicine at regular intervals. Do not take your medicine more often than directed. Do not stop taking this medicine suddenly except upon the advice of your doctor. Stopping this medicine too quickly may cause serious side effects or your condition may worsen. A special MedGuide will be given to you by the pharmacist with each prescription and refill. Be sure to read this information carefully each time. Talk to your pediatrician regarding the use of this medicine in children. Special care may be needed. Overdosage: If you think you have taken too much of this medicine contact a poison control center or emergency room at once. NOTE: This medicine is only for you. Do not share this medicine with others. What if I miss a dose? If you miss a dose, take it as soon as you can. If it is less than four hours to your next dose, take only that dose and skip the  missed dose. Do not take double or extra doses. What may interact with this medicine? Do not take this medicine with any of the following medications:  linezolid  MAOIs like Azilect, Carbex, Eldepryl, Marplan, Nardil, and Parnate  methylene blue (injected into a vein)  other medicines that contain bupropion like Zyban This medicine may also interact with the following medications:  alcohol  certain medicines for anxiety or sleep  certain medicines for blood pressure like metoprolol, propranolol  certain medicines for depression or psychotic disturbances  certain medicines for HIV or AIDS like efavirenz, lopinavir, nelfinavir, ritonavir  certain medicines for irregular heart beat like propafenone, flecainide  certain medicines for Parkinson's disease like amantadine, levodopa  certain medicines for seizures like carbamazepine, phenytoin, phenobarbital  cimetidine  clopidogrel  cyclophosphamide  digoxin  furazolidone  isoniazid  nicotine  orphenadrine  procarbazine  steroid medicines like prednisone or cortisone  stimulant medicines for attention disorders, weight loss, or to stay awake  tamoxifen  theophylline  thiotepa  ticlopidine  tramadol  warfarin This list may not describe all possible interactions. Give your health care provider a list of all the medicines, herbs, non-prescription drugs, or  dietary supplements you use. Also tell them if you smoke, drink alcohol, or use illegal drugs. Some items may interact with your medicine. What should I watch for while using this medicine? Tell your doctor if your symptoms do not get better or if they get worse. Visit your doctor or healthcare provider for regular checks on your progress. Because it may take several weeks to see the full effects of this medicine, it is important to continue your treatment as prescribed by your doctor. This medicine may cause serious skin reactions. They can happen weeks to  months after starting the medicine. Contact your healthcare provider right away if you notice fevers or flu-like symptoms with a rash. The rash may be red or purple and then turn into blisters or peeling of the skin. Or, you might notice a red rash with swelling of the face, lips or lymph nodes in your neck or under your arms. Patients and their families should watch out for new or worsening thoughts of suicide or depression. Also watch out for sudden changes in feelings such as feeling anxious, agitated, panicky, irritable, hostile, aggressive, impulsive, severely restless, overly excited and hyperactive, or not being able to sleep. If this happens, especially at the beginning of treatment or after a change in dose, call your healthcare provider. Avoid alcoholic drinks while taking this medicine. Drinking excessive alcoholic beverages, using sleeping or anxiety medicines, or quickly stopping the use of these agents while taking this medicine may increase your risk for a seizure. Do not drive or use heavy machinery until you know how this medicine affects you. This medicine can impair your ability to perform these tasks. Do not take this medicine close to bedtime. It may prevent you from sleeping. Your mouth may get dry. Chewing sugarless gum or sucking hard candy, and drinking plenty of water may help. Contact your doctor if the problem does not go away or is severe. What side effects may I notice from receiving this medicine? Side effects that you should report to your doctor or health care professional as soon as possible:  allergic reactions like skin rash, itching or hives, swelling of the face, lips, or tongue  breathing problems  changes in vision  confusion  elevated mood, decreased need for sleep, racing thoughts, impulsive behavior  fast or irregular heartbeat  hallucinations, loss of contact with reality  increased blood pressure  rash, fever, and swollen lymph nodes  redness,  blistering, peeling, or loosening of the skin, including inside the mouth  seizures  suicidal thoughts or other mood changes  unusually weak or tired  vomiting Side effects that usually do not require medical attention (report to your doctor or health care professional if they continue or are bothersome):  constipation  headache  loss of appetite  nausea  tremors  weight loss This list may not describe all possible side effects. Call your doctor for medical advice about side effects. You may report side effects to FDA at 1-800-FDA-1088. Where should I keep my medicine? Keep out of the reach of children. Store at room temperature between 20 and 25 degrees C (68 and 77 degrees F), away from direct sunlight and moisture. Keep tightly closed. Throw away any unused medicine after the expiration date. NOTE: This sheet is a summary. It may not cover all possible information. If you have questions about this medicine, talk to your doctor, pharmacist, or health care provider.  2021 Elsevier/Gold Standard (2018-10-08 14:02:47)   Plantar Fasciitis Rehab Ask your health  care provider which exercises are safe for you. Do exercises exactly as told by your health care provider and adjust them as directed. It is normal to feel mild stretching, pulling, tightness, or discomfort as you do these exercises. Stop right away if you feel sudden pain or your pain gets worse. Do not begin these exercises until told by your health care provider. Stretching and range-of-motion exercises These exercises warm up your muscles and joints and improve the movement and flexibility of your foot. These exercises also help to relieve pain. Plantar fascia stretch 1. Sit with your left / right leg crossed over your opposite knee. 2. Hold your heel with one hand with that thumb near your arch. With your other hand, hold your toes and gently pull them back toward the top of your foot. You should feel a stretch on the  base (bottom) of your toes, or the bottom of your foot (plantar fascia), or both. 3. Hold this stretch for__________ seconds. 4. Slowly release your toes and return to the starting position. Repeat __________ times. Complete this exercise __________ times a day.   Gastrocnemius stretch, standing This exercise is also called a calf (gastroc) stretch. It stretches the muscles in the back of the upper calf. 1. Stand with your hands against a wall. 2. Extend your left / right leg behind you, and bend your front knee slightly. 3. Keeping your heels on the floor, your toes facing forward, and your back knee straight, shift your weight toward the wall. Do not arch your back. You should feel a gentle stretch in your upper calf. 4. Hold this position for __________ seconds. Repeat __________ times. Complete this exercise __________ times a day.   Soleus stretch, standing This exercise is also called a calf (soleus) stretch. It stretches the muscles in the back of the lower calf. 1. Stand with your hands against a wall. 2. Extend your left / right leg behind you, and bend your front knee slightly. 3. Keeping your heels on the floor and your toes facing forward, bend your back knee and shift your weight slightly over your back leg. You should feel a gentle stretch deep in your lower calf. 4. Hold this position for __________ seconds. Repeat __________ times. Complete this exercise __________ times a day. Gastroc and soleus stretch, standing step This exercise stretches the muscles in the back of the lower leg. These muscles are in the upper calf (gastrocnemius) and the lower calf (soleus). 1. Stand with the ball of your left / right foot on the front of a step. The ball of your foot is on the walking surface, right under your toes. 2. Keep your other foot firmly on the same step. 3. Hold on to the wall or a railing for balance. 4. Slowly lift your other foot, allowing your body weight to press your heel  down over the edge of the front of the step. Keep knee straight and unbent. You should feel a stretch in your calf. 5. Hold this position for __________ seconds. 6. Return both feet to the step. 7. Repeat this exercise with a slight bend in your left / right knee. Repeat __________ times with your left / right knee straight and __________ times with your left / right knee bent. Complete this exercise __________ times a day. Balance exercise This exercise builds your balance and strength control of your arch to help take pressure off your plantar fascia. Single leg stand If this exercise is too easy, you can  try it with your eyes closed or while standing on a pillow. 1. Without shoes, stand near a railing or in a doorway. You may hold on to the railing or door frame as needed. 2. Stand on your left / right foot. Keep your big toe down on the floor and lift the arch of your foot. You should feel a stretch across the bottom of your foot and your arch. Do not let your foot roll inward. 3. Hold this position for __________ seconds. Repeat __________ times. Complete this exercise __________ times a day. This information is not intended to replace advice given to you by your health care provider. Make sure you discuss any questions you have with your health care provider. Document Revised: 04/27/2020 Document Reviewed: 04/27/2020 Elsevier Patient Education  2021 [place]. Plantar Fasciitis  Plantar fasciitis is a painful foot condition that affects the heel. It occurs when the band of tissue that connects the toes to the heel bone (plantar fascia) becomes irritated. This can happen as the result of exercising too much or doing other repetitive activities (overuse injury). Plantar fasciitis can cause mild irritation to severe pain that makes it difficult to walk or move. The pain is usually worse in the morning after sleeping, or after sitting or lying down for a period of time. Pain may also be  worse after long periods of walking or standing. What are the causes? This condition may be caused by:  Standing for long periods of time.  Wearing shoes that do not have good arch support.  Doing activities that put stress on joints (high-impact activities). This includes ballet and exercise that makes your heart beat faster (aerobic exercise), such as running.  Being overweight.  An abnormal way of walking (gait).  Tight muscles in the back of your lower leg (calf).  High arches in your feet or flat feet.  Starting a new athletic activity. What are the signs or symptoms? The main symptom of this condition is heel pain. Pain may get worse after the following:  Taking the first steps after a time of rest, especially in the morning after awakening, or after you have been sitting or lying down for a while.  Long periods of standing still. Pain may decrease after 30-45 minutes of activity, such as gentle walking. How is this diagnosed? This condition may be diagnosed based on your medical history, a physical exam, and your symptoms. Your health care provider will check for:  A tender area on the bottom of your foot.  A high arch in your foot or flat feet.  Pain when you move your foot.  Difficulty moving your foot. You may have imaging tests to confirm the diagnosis, such as:  X-rays.  Ultrasound.  MRI. How is this treated? Treatment for plantar fasciitis depends on how severe your condition is. Treatment may include:  Rest, ice, pressure (compression), and raising (elevating) the affected foot. This is called RICE therapy. Your health care provider may recommend RICE therapy along with over-the-counter pain medicines to manage your pain.  Exercises to stretch your calves and your plantar fascia.  A splint that holds your foot in a stretched, upward position while you sleep (night splint).  Physical therapy to relieve symptoms and prevent problems in the  future.  Injections of steroid medicine (cortisone) to relieve pain and inflammation.  Stimulating your plantar fascia with electrical impulses (extracorporeal shock wave therapy). This is usually the last treatment option before surgery.  Surgery, if other treatments  have not worked after 12 months. Follow these instructions at home: Managing pain, stiffness, and swelling  If directed, put ice on the painful area. To do this: ? Put ice in a plastic bag, or use a frozen bottle of water. ? Place a towel between your skin and the bag or bottle. ? Roll the bottom of your foot over the bag or bottle. ? Do this for 20 minutes, 2-3 times a day.  Wear athletic shoes that have air-sole or gel-sole cushions, or try soft shoe inserts that are designed for plantar fasciitis.  Elevate your foot above the level of your heart while you are sitting or lying down.   Activity  Avoid activities that cause pain. Ask your health care provider what activities are safe for you.  Do physical therapy exercises and stretches as told by your health care provider.  Try activities and forms of exercise that are easier on your joints (low impact). Examples include swimming, water aerobics, and biking. General instructions  Take over-the-counter and prescription medicines only as told by your health care provider.  Wear a night splint while sleeping, if told by your health care provider. Loosen the splint if your toes tingle, become numb, or turn cold and blue.  Maintain a healthy weight, or work with your health care provider to lose weight as needed.  Keep all follow-up visits. This is important. Contact a health care provider if you have:  Symptoms that do not go away with home treatment.  Pain that gets worse.  Pain that affects your ability to move or do daily activities. Summary  Plantar fasciitis is a painful foot condition that affects the heel. It occurs when the band of tissue that connects  the toes to the heel bone (plantar fascia) becomes irritated.  Heel pain is the main symptom of this condition. It may get worse after exercising too much or standing still for a long time.  Treatment varies, but it usually starts with rest, ice, pressure (compression), and raising (elevating) the affected foot. This is called RICE therapy. Over-the-counter medicines can also be used to manage pain. This information is not intended to replace advice given to you by your health care provider. Make sure you discuss any questions you have with your health care provider. Document Revised: 11/01/2019 Document Reviewed: 11/01/2019 Elsevier Patient Education  2021 [place]. Bupropion tablets (Depression/Mood Disorders) What is this medicine? BUPROPION (byoo PROE pee on) is used to treat depression. This medicine may be used for other purposes; ask your health care provider or pharmacist if you have questions. COMMON BRAND NAME(S): Wellbutrin What should I tell my health care provider before I take this medicine? They need to know if you have any of these conditions:  an eating disorder, such as anorexia or bulimia  bipolar disorder or psychosis  diabetes or high blood sugar, treated with medication  glaucoma  heart disease, previous heart attack, or irregular heart beat  head injury or brain tumor  high blood pressure  kidney or liver disease  seizures  suicidal thoughts or a previous suicide attempt  Tourette's syndrome  weight loss  an unusual or allergic reaction to bupropion, other medicines, foods, dyes, or preservatives  breast-feeding  pregnant or trying to become pregnant How should I use this medicine? Take this medicine by mouth with a glass of water. Follow the directions on the prescription label. You can take it with or without food. If it upsets your stomach, take it with food.  Take your medicine at regular intervals. Do not take your medicine more often than  directed. Do not stop taking this medicine suddenly except upon the advice of your doctor. Stopping this medicine too quickly may cause serious side effects or your condition may worsen. A special MedGuide will be given to you by the pharmacist with each prescription and refill. Be sure to read this information carefully each time. Talk to your pediatrician regarding the use of this medicine in children. Special care may be needed. Overdosage: If you think you have taken too much of this medicine contact a poison control center or emergency room at once. NOTE: This medicine is only for you. Do not share this medicine with others. What if I miss a dose? If you miss a dose, take it as soon as you can. If it is less than four hours to your next dose, take only that dose and skip the missed dose. Do not take double or extra doses. What may interact with this medicine? Do not take this medicine with any of the following medications:  linezolid  MAOIs like Azilect, Carbex, Eldepryl, Marplan, Nardil, and Parnate  methylene blue (injected into a vein)  other medicines that contain bupropion like Zyban This medicine may also interact with the following medications:  alcohol  certain medicines for anxiety or sleep  certain medicines for blood pressure like metoprolol, propranolol  certain medicines for depression or psychotic disturbances  certain medicines for HIV or AIDS like efavirenz, lopinavir, nelfinavir, ritonavir  certain medicines for irregular heart beat like propafenone, flecainide  certain medicines for Parkinson's disease like amantadine, levodopa  certain medicines for seizures like carbamazepine, phenytoin, phenobarbital  cimetidine  clopidogrel  cyclophosphamide  digoxin  furazolidone  isoniazid  nicotine  orphenadrine  procarbazine  steroid medicines like prednisone or cortisone  stimulant medicines for attention disorders, weight loss, or to stay  awake  tamoxifen  theophylline  thiotepa  ticlopidine  tramadol  warfarin This list may not describe all possible interactions. Give your health care provider a list of all the medicines, herbs, non-prescription drugs, or dietary supplements you use. Also tell them if you smoke, drink alcohol, or use illegal drugs. Some items may interact with your medicine. What should I watch for while using this medicine? Tell your doctor if your symptoms do not get better or if they get worse. Visit your doctor or healthcare provider for regular checks on your progress. Because it may take several weeks to see the full effects of this medicine, it is important to continue your treatment as prescribed by your doctor. This medicine may cause serious skin reactions. They can happen weeks to months after starting the medicine. Contact your healthcare provider right away if you notice fevers or flu-like symptoms with a rash. The rash may be red or purple and then turn into blisters or peeling of the skin. Or, you might notice a red rash with swelling of the face, lips or lymph nodes in your neck or under your arms. Patients and their families should watch out for new or worsening thoughts of suicide or depression. Also watch out for sudden changes in feelings such as feeling anxious, agitated, panicky, irritable, hostile, aggressive, impulsive, severely restless, overly excited and hyperactive, or not being able to sleep. If this happens, especially at the beginning of treatment or after a change in dose, call your healthcare provider. Avoid alcoholic drinks while taking this medicine. Drinking excessive alcoholic beverages, using sleeping or anxiety medicines, or  quickly stopping the use of these agents while taking this medicine may increase your risk for a seizure. Do not drive or use heavy machinery until you know how this medicine affects you. This medicine can impair your ability to perform these tasks. Do  not take this medicine close to bedtime. It may prevent you from sleeping. Your mouth may get dry. Chewing sugarless gum or sucking hard candy, and drinking plenty of water may help. Contact your doctor if the problem does not go away or is severe. What side effects may I notice from receiving this medicine? Side effects that you should report to your doctor or health care professional as soon as possible:  allergic reactions like skin rash, itching or hives, swelling of the face, lips, or tongue  breathing problems  changes in vision  confusion  elevated mood, decreased need for sleep, racing thoughts, impulsive behavior  fast or irregular heartbeat  hallucinations, loss of contact with reality  increased blood pressure  rash, fever, and swollen lymph nodes  redness, blistering, peeling, or loosening of the skin, including inside the mouth  seizures  suicidal thoughts or other mood changes  unusually weak or tired  vomiting Side effects that usually do not require medical attention (report to your doctor or health care professional if they continue or are bothersome):  constipation  headache  loss of appetite  nausea  tremors  weight loss This list may not describe all possible side effects. Call your doctor for medical advice about side effects. You may report side effects to FDA at 1-800-FDA-1088. Where should I keep my medicine? Keep out of the reach of children. Store at room temperature between 20 and 25 degrees C (68 and 77 degrees F), away from direct sunlight and moisture. Keep tightly closed. Throw away any unused medicine after the expiration date. NOTE: This sheet is a summary. It may not cover all possible information. If you have questions about this medicine, talk to your doctor, pharmacist, or health care provider.  2021 Elsevier/Gold Standard (2018-10-08 14:02:47)

## 2020-10-04 ENCOUNTER — Other Ambulatory Visit: Payer: Self-pay | Admitting: Adult Health

## 2020-10-04 ENCOUNTER — Encounter: Payer: Self-pay | Admitting: Adult Health

## 2020-10-04 LAB — COMPREHENSIVE METABOLIC PANEL
ALT: 22 IU/L (ref 0–32)
AST: 18 IU/L (ref 0–40)
Albumin/Globulin Ratio: 1.7 (ref 1.2–2.2)
Albumin: 4.3 g/dL (ref 3.9–5.0)
Alkaline Phosphatase: 70 IU/L (ref 44–121)
BUN/Creatinine Ratio: 13 (ref 9–23)
BUN: 11 mg/dL (ref 6–20)
Bilirubin Total: 0.3 mg/dL (ref 0.0–1.2)
CO2: 23 mmol/L (ref 20–29)
Calcium: 9.9 mg/dL (ref 8.7–10.2)
Chloride: 102 mmol/L (ref 96–106)
Creatinine, Ser: 0.86 mg/dL (ref 0.57–1.00)
Globulin, Total: 2.5 g/dL (ref 1.5–4.5)
Glucose: 85 mg/dL (ref 65–99)
Potassium: 4.3 mmol/L (ref 3.5–5.2)
Sodium: 141 mmol/L (ref 134–144)
Total Protein: 6.8 g/dL (ref 6.0–8.5)
eGFR: 94 mL/min/{1.73_m2} (ref >59)

## 2020-10-04 LAB — CBC WITH DIFFERENTIAL/PLATELET
Basophils Absolute: 0.1 10*3/uL (ref 0.0–0.2)
Basos: 1 %
EOS (ABSOLUTE): 0.5 10*3/uL — ABNORMAL HIGH (ref 0.0–0.4)
Eos: 6 %
Hematocrit: 41 % (ref 34.0–46.6)
Hemoglobin: 13.5 g/dL (ref 11.1–15.9)
Immature Grans (Abs): 0 10*3/uL (ref 0.0–0.1)
Immature Granulocytes: 0 %
Lymphocytes Absolute: 2.5 10*3/uL (ref 0.7–3.1)
Lymphs: 30 %
MCH: 29.2 pg (ref 26.6–33.0)
MCHC: 32.9 g/dL (ref 31.5–35.7)
MCV: 89 fL (ref 79–97)
Monocytes Absolute: 0.5 10*3/uL (ref 0.1–0.9)
Monocytes: 7 %
Neutrophils Absolute: 4.7 10*3/uL (ref 1.4–7.0)
Neutrophils: 56 %
Platelets: 307 10*3/uL (ref 150–450)
RBC: 4.62 x10E6/uL (ref 3.77–5.28)
RDW: 12.7 % (ref 11.7–15.4)
WBC: 8.3 10*3/uL (ref 3.4–10.8)

## 2020-10-04 LAB — FSH/LH
FSH: 5.3 m[IU]/mL
LH: 11 m[IU]/mL

## 2020-10-04 LAB — VITAMIN D 25 HYDROXY (VIT D DEFICIENCY, FRACTURES): Vit D, 25-Hydroxy: 29.8 ng/mL — ABNORMAL LOW (ref 30.0–100.0)

## 2020-10-04 LAB — BETA HCG QUANT (REF LAB): hCG Quant: <1 m[IU]/mL

## 2020-10-04 LAB — TSH: TSH: 3.74 u[IU]/mL (ref 0.450–4.500)

## 2020-10-04 MED ORDER — BUPROPION HCL ER (SR) 150 MG PO TB12
150.0000 mg | ORAL_TABLET | Freq: Two times a day (BID) | ORAL | 0 refills | Status: DC
Start: 2020-10-04 — End: 2021-01-10

## 2020-10-04 NOTE — Telephone Encounter (Signed)
Medication Refill - Medication: buPROPion (WELLBUTRIN SR) 150 MG 12 hr tablet     Preferred Pharmacy (with phone number or street name):  [facility] [place] - [place] Phone:  [phone]  Fax:  [phone]       Agent: Please be advised that RX refills may take up to 3 business days. We ask that you follow-up with your pharmacy.

## 2020-10-06 NOTE — Progress Notes (Signed)
Plantar spur on right foot and Bunion also seen.  Referral to podiatry or orthopedics advised.

## 2020-10-06 NOTE — Progress Notes (Signed)
Bunion seen and small plantar bone spur. Advise orthopedics or podiatry referral.

## 2020-10-07 NOTE — Progress Notes (Signed)
CBC is within normal limits. CMP within normal limits. TSH is within normal limits.  Vitamin  D is low, this can contribute to poor sleep and fatigue, will send in prescription for Vitamin D at 50,000 units by mouth once every 7 days/(once weekly) for 12 weeks. Advise recheck lab Vitamin D in 1-2 weeks after completing vitamin d prescription. Lab iis walk in and is closed during lunch during regular office hours. LH and Wayne within normal.  HCG negative.

## 2020-10-10 ENCOUNTER — Other Ambulatory Visit: Payer: Self-pay | Admitting: Adult Health

## 2020-10-10 DIAGNOSIS — M779 Enthesopathy, unspecified: Secondary | ICD-10-CM

## 2020-10-10 DIAGNOSIS — M21619 Bunion of unspecified foot: Secondary | ICD-10-CM

## 2020-10-17 ENCOUNTER — Ambulatory Visit: Payer: Self-pay | Admitting: Podiatry

## 2020-10-17 ENCOUNTER — Other Ambulatory Visit: Payer: Medicaid Other

## 2020-10-19 ENCOUNTER — Ambulatory Visit: Payer: Medicaid Other

## 2020-10-25 ENCOUNTER — Ambulatory Visit
Admission: RE | Admit: 2020-10-25 | Discharge: 2020-10-25 | Disposition: A | Discharge status: Home / Self Care | Payer: Medicaid Other | Source: Ambulatory Visit | Attending: Surgical Oncology | Admitting: Surgical Oncology

## 2020-10-25 DIAGNOSIS — K219 Gastro-esophageal reflux disease without esophagitis: Secondary | ICD-10-CM

## 2020-10-27 DIAGNOSIS — Z419 Encounter for procedure for purposes other than remedying health state, unspecified: Secondary | ICD-10-CM | POA: Diagnosis not present

## 2020-10-31 ENCOUNTER — Encounter: Payer: Medicaid Other | Admitting: Obstetrics and Gynecology

## 2020-10-31 DIAGNOSIS — Z713 Dietary counseling and surveillance: Secondary | ICD-10-CM | POA: Diagnosis not present

## 2020-11-01 ENCOUNTER — Encounter: Payer: Self-pay | Admitting: Podiatry

## 2020-11-01 ENCOUNTER — Ambulatory Visit (INDEPENDENT_AMBULATORY_CARE_PROVIDER_SITE_OTHER): Payer: Medicaid Other | Admitting: Podiatry

## 2020-11-01 ENCOUNTER — Other Ambulatory Visit: Payer: Self-pay

## 2020-11-01 DIAGNOSIS — M2012 Hallux valgus (acquired), left foot: Secondary | ICD-10-CM | POA: Diagnosis not present

## 2020-11-01 DIAGNOSIS — M2142 Flat foot [pes planus] (acquired), left foot: Secondary | ICD-10-CM

## 2020-11-01 DIAGNOSIS — M2141 Flat foot [pes planus] (acquired), right foot: Secondary | ICD-10-CM

## 2020-11-01 DIAGNOSIS — M722 Plantar fascial fibromatosis: Secondary | ICD-10-CM | POA: Diagnosis not present

## 2020-11-01 DIAGNOSIS — M2011 Hallux valgus (acquired), right foot: Secondary | ICD-10-CM | POA: Diagnosis not present

## 2020-11-01 DIAGNOSIS — M21612 Bunion of left foot: Secondary | ICD-10-CM

## 2020-11-01 DIAGNOSIS — M21611 Bunion of right foot: Secondary | ICD-10-CM | POA: Diagnosis not present

## 2020-11-01 MED ORDER — MELOXICAM 15 MG PO TABS: 15.0000 mg | ORAL_TABLET | Freq: Every day | ORAL | 3 refills | Status: DC

## 2020-11-01 MED ORDER — TRIAMCINOLONE ACETONIDE 10 MG/ML IJ SUSP
10.0000 mg | Freq: Once | INTRAMUSCULAR | Status: AC
Start: 2020-11-01 — End: 2020-11-01
  Administered 2020-11-01: 10 mg

## 2020-11-01 MED ORDER — DEXAMETHASONE SODIUM PHOSPHATE 4 MG/ML IJ SOLN
4.0000 mg | Freq: Once | INTRAMUSCULAR | Status: AC
  Administered 2020-11-01: 4 mg

## 2020-11-01 NOTE — Patient Instructions (Signed)

## 2020-11-01 NOTE — Progress Notes (Signed)
  Subjective:  Patient ID: Denise Contreras, female    DOB: 1990-11-30,  MRN: [id number]  Chief Complaint  Patient presents with  . Foot Pain    Patient presents today for bilat heel/arch pain x years and nail dystrophy right great toenail    30 y.o. female presents with the above complaint. History confirmed with patient.  She works on her feet and the feet began to ache towards the end of the day in the arch and in the heels.  She also has bunions she has been told.  The nail fell off at one point and is growing back and is causing some discomfort  Objective:  Physical Exam: warm, good capillary refill, no trophic changes or ulcerative lesions, normal DP and PT pulses and normal sensory exam.  Bilateral she has pes planus with flexible bunion deformity, pain on palpation to the mid arch and plantar heel at the insertion of calcaneus of the fascia.  Nail dystrophy right hallux , no ingrown or paronychia  Radiographs: X-ray of bilateral previous radiographs reviewed she has a bilateral hallux valgus but these are nonweightbearing exam Assessment:   1. Pes planus of both feet   2. Plantar fasciitis of right foot   3. Plantar fasciitis of left foot   4. Hallux valgus with bunions, left   5. Hallux valgus with bunions, right      Plan:  Patient was evaluated and treated and all questions answered.  Discussed the etiology and treatment options for plantar fasciitis including stretching, formal physical therapy, supportive shoegears such as a running shoe or sneaker, pre fabricated orthoses, injection therapy, and oral medications. We also discussed the role of surgical treatment of this for patients who do not improve after exhausting non-surgical treatment options.    -XR reviewed with patient -Educated patient on stretching and icing of the affected limb -Injection delivered to the plantar fascia of both feet. -Rx for meloxicam. Educated on use, risks and benefits of the medication   -Recommend power steps because I think her pes planus deformity is contributing greatly to this.  She purchases today -She has bariatric surgery scheduled in a few months I think this will help significantly with weight loss to improve her pain and deformity  After sterile prep with povidone-iodine solution and alcohol, the bilateral heel was injected with 0.5cc 2% xylocaine plain, 0.5cc 0.5% marcaine plain, 5mg  triamcinolone acetonide, and 2mg  dexamethasone was injected along  the plantar fascia at the insertion on the plantar calcaneus. The patient tolerated the procedure well without complication.   For now her bunions are not particular painful.  We discussed surgical and nonsurgical treatment options.  Recommend she continue to monitor these and if they are painful we will discuss further.  Possibly would be a candidate for bunion surgery after her weight loss surgery   The nail dystrophy is secondary to previous traumatic nail avulsion I think this will continue to grow out to be relatively asymptomatic.  If it becomes ingrown and causes of infection she will return for treatment with this   Return in about 1 month (around 12/01/2020) for recheck plantar fasciitis.

## 2020-11-06 DIAGNOSIS — M722 Plantar fascial fibromatosis: Secondary | ICD-10-CM

## 2020-11-13 NOTE — Telephone Encounter (Signed)
Sent message to patient regarding Sharyn Lull leaving.

## 2020-11-15 ENCOUNTER — Ambulatory Visit: Payer: Self-pay | Admitting: Adult Health

## 2020-11-19 ENCOUNTER — Encounter: Payer: Self-pay | Admitting: Adult Health

## 2020-11-23 ENCOUNTER — Ambulatory Visit (INDEPENDENT_AMBULATORY_CARE_PROVIDER_SITE_OTHER): Payer: Medicaid Other | Admitting: Obstetrics and Gynecology

## 2020-11-23 ENCOUNTER — Encounter: Payer: Self-pay | Admitting: Obstetrics and Gynecology

## 2020-11-23 ENCOUNTER — Other Ambulatory Visit: Payer: Self-pay

## 2020-11-23 VITALS — BP 128/82 | Ht 63.0 in | Wt 334.0 lb

## 2020-11-23 DIAGNOSIS — Z124 Encounter for screening for malignant neoplasm of cervix: Secondary | ICD-10-CM

## 2020-11-23 DIAGNOSIS — Z3009 Encounter for other general counseling and advice on contraception: Secondary | ICD-10-CM

## 2020-11-23 NOTE — Patient Instructions (Addendum)
Laparoscopic Tubal Ligation Laparoscopic tubal ligation is a procedure to close the fallopian tubes. This is done to prevent pregnancy. When the fallopian tubes are closed, the eggs that your ovaries release cannot enter the uterus, and sperm cannot reach the released eggs. You should not have this procedure if you want to get pregnant someday or if you are unsure about having more children. Tell a health care provider about:  Any allergies you have.  All medicines you are taking, including vitamins, herbs, eye drops, creams, and over-the-counter medicines.  Any problems you or family members have had with anesthetic medicines.  Any blood disorders you have.  Any surgeries you have had.  Any medical conditions you have.  Whether you are pregnant or may be pregnant.  Any past pregnancies. What are the risks? Generally, this is a safe procedure. However, problems may occur, including:  Infection.  Bleeding.  Injury to other organs in the abdomen.  Side effects from anesthetic medicines.  Failure of the procedure. This procedure can increase your risk of an ectopic pregnancy. This is a pregnancy in which a fertilized egg attaches to the outside of the uterus. What happens before the procedure? Staying hydrated Follow instructions from your health care provider about hydration, which may include:  Up to 2 hours before the procedure - you may continue to drink clear liquids, such as water, clear fruit juice, black coffee, and plain tea. Eating and drinking restrictions Follow instructions from your health care provider about eating and drinking, which may include:  8 hours before the procedure - stop eating heavy meals or foods, such as meat, fried foods, or fatty foods.  6 hours before the procedure - stop eating light meals or foods, such as toast or cereal.  6 hours before the procedure - stop drinking milk or drinks that contain milk.  2 hours before the procedure - stop  drinking clear liquids. Medicines Ask your health care provider about:  Changing or stopping your regular medicines. This is especially important if you are taking diabetes medicines or blood thinners.  Taking medicines such as aspirin and ibuprofen. These medicines can thin your blood. Do not take these medicines unless your health care provider tells you to take them.  Taking over-the-counter medicines, vitamins, herbs, and supplements. Surgery safety Ask your health care provider:  How your surgery site will be marked.  What steps will be taken to help prevent infection. These steps may include: ? Removing hair at the surgery site. ? Washing skin with a germ-killing soap. ? Taking antibiotic medicine. General instructions  Do not use any products that contain nicotine or tobacco for at least 4 weeks before the procedure. These products include cigarettes, chewing tobacco, and vaping devices, such as e-cigarettes. If you need help quitting, ask your health care provider.  Plan to have someone take you home from the hospital.  If you will be going home right after the procedure, plan to have a responsible adult care for you for the time you are told. This is important. What happens during the procedure?  An IV will be inserted into one of your veins.  You will be given one or more of the following: ? A medicine to help you relax (sedative). ? A medicine to numb the area (local anesthetic). ? A medicine to make you fall asleep (general anesthetic). ? A medicine that is injected into an area of your body to numb everything below the injection site (regional anesthetic).  Your bladder  may be emptied with a small tube (catheter).  If you have been given a general anesthetic, a tube will be put down your throat to help you breathe.  Two small incisions will be made in your lower abdomen and near your belly button.  Your abdomen will be inflated with a gas. This will let the  surgeon see better and will give the surgeon room to work.  A lighted tube with camera (laparoscope) will be inserted into your abdomen through one of the incisions. Small instruments will be inserted through the other incision.  The fallopian tubes will be tied off, burned (cauterized), or blocked with a clip, ring, or clamp. A small portion in the center of each fallopian tube may be removed.  The gas will be released from the abdomen.  The incisions will be closed with stitches (sutures).  A bandage (dressing) will be placed over the incisions. The procedure may vary among health care providers and hospitals.      What happens after the procedure?  Your blood pressure, heart rate, breathing rate, and blood oxygen level will be monitored until you leave the hospital.  You will be given medicine to help with pain, nausea, and vomiting as needed.  You may have vaginal discharge after the procedure. You may need to wear a sanitary napkin.  If you were given a sedative during the procedure, it can affect you for several hours. Do not drive or operate machinery until your health care provider says that it is safe. Summary  Laparoscopic tubal ligation is a procedure that is done to prevent pregnancy.  You should not have this procedure if you want to get pregnant someday or if you are unsure about having more children.  The procedure is done using a thin, lighted tube (laparoscope) with a camera attached that will be inserted into your abdomen through an incision.  After the procedure you will be given medicine to help with pain, nausea, and vomiting as needed.  Plan to have someone take you home from the hospital. This information is not intended to replace advice given to you by your health care provider. Make sure you discuss any questions you have with your health care provider. Document Revised: 03/31/2020 Document Reviewed: 03/31/2020 Elsevier Patient Education  2021 [employer].  Contraception Choices Contraception, also called birth control, refers to methods or devices that prevent pregnancy. Hormonal methods Contraceptive implant A contraceptive implant is a thin, plastic tube that contains a hormone that prevents pregnancy. It is different from an intrauterine device (IUD). It is inserted into the upper part of the arm by a health care provider. Implants can be effective for up to 3 years. Progestin-only injections Progestin-only injections are injections of progestin, a synthetic form of the hormone progesterone. They are given every 3 months by a health care provider. Birth control pills Birth control pills are pills that contain hormones that prevent pregnancy. They must be taken once a day, preferably at the same time each day. A prescription is needed to use this method of contraception. Birth control patch The birth control patch contains hormones that prevent pregnancy. It is placed on the skin and must be changed once a week for three weeks and removed on the fourth week. A prescription is needed to use this method of contraception. Vaginal ring A vaginal ring contains hormones that prevent pregnancy. It is placed in the vagina for three weeks and removed on the fourth week. After that, the process is  repeated with a new ring. A prescription is needed to use this method of contraception. Emergency contraceptive Emergency contraceptives prevent pregnancy after unprotected sex. They come in pill form and can be taken up to 5 days after sex. They work best the sooner they are taken after having sex. Most emergency contraceptives are available without a prescription. This method should not be used as your only form of birth control.   Barrier methods Female condom A female condom is a thin sheath that is worn over the penis during sex. Condoms keep sperm from going inside a woman's body. They can be used with a sperm-killing substance (spermicide) to increase  their effectiveness. They should be thrown away after one use. Female condom A female condom is a soft, loose-fitting sheath that is put into the vagina before sex. The condom keeps sperm from going inside a woman's body. They should be thrown away after one use. Diaphragm A diaphragm is a soft, dome-shaped barrier. It is inserted into the vagina before sex, along with a spermicide. The diaphragm blocks sperm from entering the uterus, and the spermicide kills sperm. A diaphragm should be left in the vagina for 6-8 hours after sex and removed within 24 hours. A diaphragm is prescribed and fitted by a health care provider. A diaphragm should be replaced every 1-2 years, after giving birth, after gaining more than 15 lb (6.8 kg), and after pelvic surgery. Cervical cap A cervical cap is a round, soft latex or plastic cup that fits over the cervix. It is inserted into the vagina before sex, along with spermicide. It blocks sperm from entering the uterus. The cap should be left in place for 6-8 hours after sex and removed within 48 hours. A cervical cap must be prescribed and fitted by a health care provider. It should be replaced every 2 years. Sponge A sponge is a soft, circular piece of polyurethane foam with spermicide in it. The sponge helps block sperm from entering the uterus, and the spermicide kills sperm. To use it, you make it wet and then insert it into the vagina. It should be inserted before sex, left in for at least 6 hours after sex, and removed and thrown away within 30 hours. Spermicides Spermicides are chemicals that kill or block sperm from entering the cervix and uterus. They can come as a cream, jelly, suppository, foam, or tablet. A spermicide should be inserted into the vagina with an applicator at least 16-10 minutes before sex to allow time for it to work. The process must be repeated every time you have sex. Spermicides do not require a prescription.   Intrauterine  contraception Intrauterine device (IUD) An IUD is a T-shaped device that is put in a woman's uterus. There are two types:  Hormone IUD.This type contains progestin, a synthetic form of the hormone progesterone. This type can stay in place for 3-5 years.  Copper IUD.This type is wrapped in copper wire. It can stay in place for 10 years. Permanent methods of contraception Female tubal ligation In this method, a woman's fallopian tubes are sealed, tied, or blocked during surgery to prevent eggs from traveling to the uterus. Hysteroscopic sterilization In this method, a small, flexible insert is placed into each fallopian tube. The inserts cause scar tissue to form in the fallopian tubes and block them, so sperm cannot reach an egg. The procedure takes about 3 months to be effective. Another form of birth control must be used during those 3 months. Female sterilization  This is a procedure to tie off the tubes that carry sperm (vasectomy). After the procedure, the man can still ejaculate fluid (semen). Another form of birth control must be used for 3 months after the procedure. Natural planning methods Natural family planning In this method, a couple does not have sex on days when the woman could become pregnant. Calendar method In this method, the woman keeps track of the length of each menstrual cycle, identifies the days when pregnancy can happen, and does not have sex on those days. Ovulation method In this method, a couple avoids sex during ovulation. Symptothermal method This method involves not having sex during ovulation. The woman typically checks for ovulation by watching changes in her temperature and in the consistency of cervical mucus. Post-ovulation method In this method, a couple waits to have sex until after ovulation. Where to find more information  Centers for Disease Control and Prevention: http://www.wolf.info/ Summary  Contraception, also called birth control, refers to methods or  devices that prevent pregnancy.  Hormonal methods of contraception include implants, injections, pills, patches, vaginal rings, and emergency contraceptives.  Barrier methods of contraception can include female condoms, female condoms, diaphragms, cervical caps, sponges, and spermicides.  There are two types of IUDs (intrauterine devices). An IUD can be put in a woman's uterus to prevent pregnancy for 3-5 years.  Permanent sterilization can be done through a procedure for males and females. Natural family planning methods involve nothaving sex on days when the woman could become pregnant. This information is not intended to replace advice given to you by your health care provider. Make sure you discuss any questions you have with your health care provider. Document Revised: 12/20/2019 Document Reviewed: 12/20/2019 Elsevier Patient Education  [employer].

## 2020-11-23 NOTE — Progress Notes (Signed)
Patient ID: Denise Contreras, female   DOB: 06-17-1991, 30 y.o.   MRN: [id number]  Reason for Consult: Consult   Referred by Doreen Beam, F*  Subjective:     HPI:  Denise Contreras is a 30 y.o. female. She is here for a sterilization consult. However, when asked if she was done having children she responded that she was unsure.   Gynecological History  Patient's last menstrual period was 11/22/2020. Menarche: 13  She denies passage of large clots She denies sensations of gushing or flooding of blood. She denies accidents where she bleeds through her clothing. She denies that she changes a saturated pad or tampon more frequently than every hour.  She denies that pain from her periods limits her activities.  History of fibroids, polyps, or ovarian cysts? : no  History of PCOS? no Hstory of Endometriosis? no History of abnormal pap smears? no Have you had any sexually transmitted infections in the past? Yes, remote history of chlamydia in 2014  She denies HPV vaccination in the past.   Pap smear is due, but she declines today.    She identifies as a female. She is sexually active with men.   She denies dyspareunia. She denies postcoital bleeding.  She currently uses none for contraception.   Obstetrical History OB History  Gravida Para Term Preterm AB Living  2 2 2     2   SAB IAB Ectopic Multiple Live Births               # Outcome Date GA Lbr Len/2nd Weight Sex Delivery Anes PTL Lv  2 Term           1 Term              Past Medical History:  Diagnosis Date  . Allergy   . Asthma   . Depression   . Hypertension    Family History  Problem Relation Age of Onset  . COPD Mother   . Hypertension Mother   . Alcohol abuse Mother   . Cancer Father   . Hypertension Father   . Alcohol abuse Paternal Uncle   . Alcohol abuse Maternal Grandmother   . COPD Maternal Grandmother   . Alcohol abuse Maternal Grandfather   . Hypertension Paternal Grandmother    . Stroke Paternal Grandmother   . Cancer Paternal Grandfather    Past Surgical History:  Procedure Laterality Date  . CESAREAN SECTION      Short Social History:  Social History   Tobacco Use  . Smoking status: Never Smoker  . Smokeless tobacco: Never Used  Substance Use Topics  . Alcohol use: Not Currently    Allergies  Allergen Reactions  . Sulfa Antibiotics   . Nickel Rash    Current Outpatient Medications  Medication Sig Dispense Refill  . Biotin 5 MG TBDP     . buPROPion (WELLBUTRIN SR) 150 MG 12 hr tablet Take 1 tablet (150 mg total) by mouth 2 (two) times daily. 60 tablet 0  . cetirizine (ZYRTEC) 10 MG tablet Take 1 tablet (10 mg total) by mouth daily. 30 tablet 11  . lisinopril (ZESTRIL) 20 MG tablet Take 1 tablet (20 mg total) by mouth daily. 90 tablet 1  . Multiple Vitamin (MULTIVITAMIN) tablet Take 1 tablet by mouth daily.    . Vitamin D, Ergocalciferol, (DRISDOL) 1.25 MG (50000 UNIT) CAPS capsule Take 1 capsule (50,000 Units total) by mouth every 7 (seven) days. (taking one tablet per  week) walk in lab in office 1-2 weeks after completing prescription. Discontinue over the counter vitamin D while taking this. 12 capsule 0  . albuterol (VENTOLIN HFA) 108 (90 Base) MCG/ACT inhaler Inhale 1-2 puffs into the lungs every 6 (six) hours as needed for wheezing or shortness of breath. (Patient not taking: Reported on 11/23/2020) 8 g 0  . meloxicam (MOBIC) 15 MG tablet Take 1 tablet (15 mg total) by mouth daily. (Patient not taking: Reported on 11/23/2020) 30 tablet 3   No current facility-administered medications for this visit.    Review of Systems  Constitutional: Negative for chills, fatigue, fever and unexpected weight change.  HENT: Negative for trouble swallowing.  Eyes: Negative for loss of vision.  Respiratory: Negative for cough, shortness of breath and wheezing.  Cardiovascular: Negative for chest pain, leg swelling, palpitations and syncope.  GI: Negative for  abdominal pain, blood in stool, diarrhea, nausea and vomiting.  GU: Negative for difficulty urinating, dysuria, frequency and hematuria.  Musculoskeletal: Negative for back pain, leg pain and joint pain.  Skin: Negative for rash.  Neurological: Negative for dizziness, headaches, light-headedness, numbness and seizures.  Psychiatric: Negative for behavioral problem, confusion, depressed mood and sleep disturbance.        Objective:  Objective   Vitals:   11/23/20 1535  BP: 128/82  Weight: (!) 334 lb (151.5 kg)  Height: 5\' 3"  (1.6 m)   Body mass index is 59.17 kg/m.  Physical Exam Vitals and nursing note reviewed. Exam conducted with a chaperone present.  Constitutional:      Appearance: Normal appearance.  HENT:     Head: Normocephalic and atraumatic.  Eyes:     Extraocular Movements: Extraocular movements intact.     Pupils: Pupils are equal, round, and reactive to light.  Cardiovascular:     Rate and Rhythm: Normal rate and regular rhythm.  Pulmonary:     Effort: Pulmonary effort is normal.     Breath sounds: Normal breath sounds.  Abdominal:     General: Abdomen is flat.     Palpations: Abdomen is soft.  Musculoskeletal:     Cervical back: Normal range of motion.  Skin:    General: Skin is warm and dry.  Neurological:     General: No focal deficit present.     Mental Status: She is alert and oriented to person, place, and time.  Psychiatric:        Behavior: Behavior normal.        Thought Content: Thought content normal.        Judgment: Judgment normal.     Assessment/Plan:    30 yo who desires contraception.  She is uncertain if a tubal ligation is what she desires at this time. We reviewed all available options for contraception. We discussed the pros and cons of these methods in detail. She felt unsure regarding what method she desires for contraception at the end of the visit. I provided her with several resources so that she can continue to consider  options and directed her to the web resource bedsider.org.  She declined a pap smear today.   Return or send a mychart message if she would like to start a method of contraception.   More than 20 minutes were spent face to face with the patient in the room, reviewing the medical record, labs and images, and coordinating care for the patient. The plan of management was discussed in detail and counseling was provided.    Adrian Prows MD,  [place] 11/23/2020 3:54 PM         Adrian Prows MD [place] 11/23/2020 3:53 PM

## 2020-11-26 DIAGNOSIS — Z419 Encounter for procedure for purposes other than remedying health state, unspecified: Secondary | ICD-10-CM | POA: Diagnosis not present

## 2020-11-29 ENCOUNTER — Encounter: Payer: Medicaid Other | Admitting: Podiatry

## 2020-12-05 ENCOUNTER — Encounter: Payer: Medicaid Other | Admitting: Certified Nurse Midwife

## 2020-12-06 ENCOUNTER — Encounter: Payer: Medicaid Other | Admitting: Podiatry

## 2020-12-06 ENCOUNTER — Encounter: Payer: Self-pay | Admitting: Certified Nurse Midwife

## 2020-12-17 ENCOUNTER — Encounter: Payer: Self-pay | Admitting: Adult Health

## 2020-12-18 ENCOUNTER — Telehealth: Payer: Self-pay

## 2020-12-18 ENCOUNTER — Other Ambulatory Visit: Payer: Self-pay | Admitting: Adult Health

## 2020-12-18 DIAGNOSIS — E559 Vitamin D deficiency, unspecified: Secondary | ICD-10-CM

## 2020-12-18 MED ORDER — VITAMIN D (ERGOCALCIFEROL) 1.25 MG (50000 UNIT) PO CAPS: 50000.0000 [IU] | ORAL_CAPSULE | ORAL | 0 refills | Status: DC

## 2020-12-18 NOTE — Telephone Encounter (Signed)
Called patient to relay provider message. Left message to call back. See pt message on 5/22.

## 2020-12-18 NOTE — Telephone Encounter (Signed)
Placed call to patient to relay provider message. Left message to call back

## 2020-12-19 NOTE — Telephone Encounter (Signed)
Left message to call back  

## 2020-12-19 NOTE — Telephone Encounter (Signed)
PT called back to return missed call

## 2020-12-20 NOTE — Telephone Encounter (Signed)
Called patient to relay provider message. Patient verbalized understanding

## 2020-12-20 NOTE — Telephone Encounter (Signed)
Spoke with patient 5/25

## 2020-12-27 ENCOUNTER — Other Ambulatory Visit: Payer: Self-pay

## 2020-12-27 ENCOUNTER — Ambulatory Visit (INDEPENDENT_AMBULATORY_CARE_PROVIDER_SITE_OTHER): Payer: Medicaid Other | Admitting: Podiatry

## 2020-12-27 DIAGNOSIS — M2141 Flat foot [pes planus] (acquired), right foot: Secondary | ICD-10-CM | POA: Diagnosis not present

## 2020-12-27 DIAGNOSIS — M2142 Flat foot [pes planus] (acquired), left foot: Secondary | ICD-10-CM | POA: Diagnosis not present

## 2020-12-27 DIAGNOSIS — M722 Plantar fascial fibromatosis: Secondary | ICD-10-CM | POA: Diagnosis not present

## 2020-12-27 DIAGNOSIS — Z419 Encounter for procedure for purposes other than remedying health state, unspecified: Secondary | ICD-10-CM | POA: Diagnosis not present

## 2020-12-27 NOTE — Patient Instructions (Addendum)
Try getting a gel heel pad from the pharmacy as well     Plantar Fasciitis (Heel Spur Syndrome) with Rehab The plantar fascia is a fibrous, ligament-like, soft-tissue structure that spans the bottom of the foot. Plantar fasciitis is a condition that causes pain in the foot due to inflammation of the tissue. SYMPTOMS   Pain and tenderness on the underneath side of the foot.  Pain that worsens with standing or walking. CAUSES  Plantar fasciitis is caused by irritation and injury to the plantar fascia on the underneath side of the foot. Common mechanisms of injury include:  Direct trauma to bottom of the foot.  Damage to a small nerve that runs under the foot where the main fascia attaches to the heel bone.  Stress placed on the plantar fascia due to bone spurs. RISK INCREASES WITH:   Activities that place stress on the plantar fascia (running, jumping, pivoting, or cutting).  Poor strength and flexibility.  Improperly fitted shoes.  Tight calf muscles.  Flat feet.  Failure to warm-up properly before activity.  Obesity. PREVENTION  Warm up and stretch properly before activity.  Allow for adequate recovery between workouts.  Maintain physical fitness:  Strength, flexibility, and endurance.  Cardiovascular fitness.  Maintain a health body weight.  Avoid stress on the plantar fascia.  Wear properly fitted shoes, including arch supports for individuals who have flat feet.  PROGNOSIS  If treated properly, then the symptoms of plantar fasciitis usually resolve without surgery. However, occasionally surgery is necessary.  RELATED COMPLICATIONS   Recurrent symptoms that may result in a chronic condition.  Problems of the lower back that are caused by compensating for the injury, such as limping.  Pain or weakness of the foot during push-off following surgery.  Chronic inflammation, scarring, and partial or complete fascia tear, occurring more often from repeated  injections.  TREATMENT  Treatment initially involves the use of ice and medication to help reduce pain and inflammation. The use of strengthening and stretching exercises may help reduce pain with activity, especially stretches of the Achilles tendon. These exercises may be performed at home or with a therapist. Your caregiver may recommend that you use heel cups of arch supports to help reduce stress on the plantar fascia. Occasionally, corticosteroid injections are given to reduce inflammation. If symptoms persist for greater than 6 months despite non-surgical (conservative), then surgery may be recommended.   MEDICATION   If pain medication is necessary, then nonsteroidal anti-inflammatory medications, such as aspirin and ibuprofen, or other minor pain relievers, such as acetaminophen, are often recommended.  Do not take pain medication within 7 days before surgery.  Prescription pain relievers may be given if deemed necessary by your caregiver. Use only as directed and only as much as you need.  Corticosteroid injections may be given by your caregiver. These injections should be reserved for the most serious cases, because they may only be given a certain number of times.  HEAT AND COLD  Cold treatment (icing) relieves pain and reduces inflammation. Cold treatment should be applied for 10 to 15 minutes every 2 to 3 hours for inflammation and pain and immediately after any activity that aggravates your symptoms. Use ice packs or massage the area with a piece of ice (ice massage).  Heat treatment may be used prior to performing the stretching and strengthening activities prescribed by your caregiver, physical therapist, or athletic trainer. Use a heat pack or soak the injury in warm water.  [place]  IF:  Treatment seems to offer no benefit, or the condition worsens.  Any medications produce adverse side effects.  EXERCISES- RANGE OF MOTION (ROM) AND STRETCHING  EXERCISES - Plantar Fasciitis (Heel Spur Syndrome) These exercises may help you when beginning to rehabilitate your injury. Your symptoms may resolve with or without further involvement from your physician, physical therapist or athletic trainer. While completing these exercises, remember:   Restoring tissue flexibility helps normal motion to return to the joints. This allows healthier, less painful movement and activity.  An effective stretch should be held for at least 30 seconds.  A stretch should never be painful. You should only feel a gentle lengthening or release in the stretched tissue.  RANGE OF MOTION - Toe Extension, Flexion  Sit with your right / left leg crossed over your opposite knee.  Grasp your toes and gently pull them back toward the top of your foot. You should feel a stretch on the bottom of your toes and/or foot.  Hold this stretch for 10 seconds.  Now, gently pull your toes toward the bottom of your foot. You should feel a stretch on the top of your toes and or foot.  Hold this stretch for 10 seconds. Repeat  times. Complete this stretch 3 times per day.   RANGE OF MOTION - Ankle Dorsiflexion, Active Assisted  Remove shoes and sit on a chair that is preferably not on a carpeted surface.  Place right / left foot under knee. Extend your opposite leg for support.  Keeping your heel down, slide your right / left foot back toward the chair until you feel a stretch at your ankle or calf. If you do not feel a stretch, slide your bottom forward to the edge of the chair, while still keeping your heel down.  Hold this stretch for 10 seconds. Repeat 3 times. Complete this stretch 2 times per day.   STRETCH  Gastroc, Standing  Place hands on wall.  Extend right / left leg, keeping the front knee somewhat bent.  Slightly point your toes inward on your back foot.  Keeping your right / left heel on the floor and your knee straight, shift your weight toward the wall,  not allowing your back to arch.  You should feel a gentle stretch in the right / left calf. Hold this position for 10 seconds. Repeat 3 times. Complete this stretch 2 times per day.  STRETCH  Soleus, Standing  Place hands on wall.  Extend right / left leg, keeping the other knee somewhat bent.  Slightly point your toes inward on your back foot.  Keep your right / left heel on the floor, bend your back knee, and slightly shift your weight over the back leg so that you feel a gentle stretch deep in your back calf.  Hold this position for 10 seconds. Repeat 3 times. Complete this stretch 2 times per day.  STRETCH  Gastrocsoleus, Standing  Note: This exercise can place a lot of stress on your foot and ankle. Please complete this exercise only if specifically instructed by your caregiver.   Place the ball of your right / left foot on a step, keeping your other foot firmly on the same step.  Hold on to the wall or a rail for balance.  Slowly lift your other foot, allowing your body weight to press your heel down over the edge of the step.  You should feel a stretch in your right / left calf.  Hold this position  for 10 seconds.  Repeat this exercise with a slight bend in your right / left knee. Repeat 3 times. Complete this stretch 2 times per day.   STRENGTHENING EXERCISES - Plantar Fasciitis (Heel Spur Syndrome)  These exercises may help you when beginning to rehabilitate your injury. They may resolve your symptoms with or without further involvement from your physician, physical therapist or athletic trainer. While completing these exercises, remember:   Muscles can gain both the endurance and the strength needed for everyday activities through controlled exercises.  Complete these exercises as instructed by your physician, physical therapist or athletic trainer. Progress the resistance and repetitions only as guided.  STRENGTH - Towel Curls  Sit in a chair positioned on a  non-carpeted surface.  Place your foot on a towel, keeping your heel on the floor.  Pull the towel toward your heel by only curling your toes. Keep your heel on the floor. Repeat 3 times. Complete this exercise 2 times per day.  STRENGTH - Ankle Inversion  Secure one end of a rubber exercise band/tubing to a fixed object (table, pole). Loop the other end around your foot just before your toes.  Place your fists between your knees. This will focus your strengthening at your ankle.  Slowly, pull your big toe up and in, making sure the band/tubing is positioned to resist the entire motion.  Hold this position for 10 seconds.  Have your muscles resist the band/tubing as it slowly pulls your foot back to the starting position. Repeat 3 times. Complete this exercises 2 times per day.  Document Released: 07/15/2005 Document Revised: 10/07/2011 Document Reviewed: 10/27/2008 [facility] Patient Information 2014 [place], [place].

## 2020-12-28 NOTE — Addendum Note (Signed)
Addended by: Leeanne Rio on: 12/28/2020 09:32 AM   Modules accepted: Orders

## 2020-12-29 ENCOUNTER — Other Ambulatory Visit: Payer: Medicaid Other

## 2020-12-31 ENCOUNTER — Other Ambulatory Visit: Payer: Self-pay | Admitting: Adult Health

## 2020-12-31 ENCOUNTER — Encounter: Payer: Self-pay | Admitting: Podiatry

## 2020-12-31 NOTE — Progress Notes (Signed)
  Subjective:  Patient ID: Denise Contreras, female    DOB: 21-Feb-1991,  MRN: [id number]  Chief Complaint  Patient presents with  . Flat Foot    6 week follow up  . Plantar Fasciitis    30 y.o. female presents with the above complaint. History confirmed with patient.  Has had some improvement.  She been doing her stretching exercises.  Most the pain is in the mid arch.  Objective:  Physical Exam: warm, good capillary refill, no trophic changes or ulcerative lesions, normal DP and PT pulses and normal sensory exam.  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 Pes planus of both feet   2. Plantar fasciitis of right foot   3. Plantar fasciitis of left foot      Plan:  Patient was evaluated and treated and all questions answered.  Discussed the etiology and treatment options for plantar fasciitis including stretching, formal physical therapy, supportive shoegears such as a running shoe or sneaker, pre fabricated orthoses, injection therapy, and oral medications. We also discussed the role of surgical treatment of this for patients who do not improve after exhausting non-surgical treatment options.    -Continue stretching and icing of the affected limb -Continue as needed meloxicam. Educated on use, risks and benefits of the medication  -Recommend using a gel heel pad to offload the pressure in the heel Return in about 1 month (around 01/26/2021).

## 2021-01-03 ENCOUNTER — Ambulatory Visit (INDEPENDENT_AMBULATORY_CARE_PROVIDER_SITE_OTHER): Payer: Medicaid Other | Admitting: Certified Nurse Midwife

## 2021-01-03 ENCOUNTER — Encounter: Payer: Self-pay | Admitting: Certified Nurse Midwife

## 2021-01-03 ENCOUNTER — Encounter: Payer: Medicaid Other | Admitting: Podiatry

## 2021-01-03 ENCOUNTER — Other Ambulatory Visit (HOSPITAL_COMMUNITY)
Admission: RE | Admit: 2021-01-03 | Discharge: 2021-01-03 | Disposition: A | Discharge status: Home / Self Care | Payer: Medicaid Other | Source: Ambulatory Visit | Attending: Certified Nurse Midwife | Admitting: Certified Nurse Midwife

## 2021-01-03 ENCOUNTER — Other Ambulatory Visit: Payer: Self-pay

## 2021-01-03 VITALS — BP 138/81 | HR 81 | Ht 63.0 in | Wt 337.9 lb

## 2021-01-03 DIAGNOSIS — N926 Irregular menstruation, unspecified: Secondary | ICD-10-CM | POA: Diagnosis not present

## 2021-01-03 NOTE — Patient Instructions (Signed)
Abnormal Uterine Bleeding Abnormal uterine bleeding means bleeding more than usual from your womb (uterus). It can include:  Bleeding between menstrual periods.  Bleeding after sex.  Bleeding that is heavier than normal.  Menstrual periods that last longer than usual.  Bleeding after you have stopped having your menstrual period (menopause). There are many problems that may cause this. You should see a doctor for any kind of bleeding that is not normal. Treatment depends on the cause of the bleeding. Follow these instructions at home: Medicines  Take over-the-counter and prescription medicines only as told by your doctor.  Tell your doctor about other medicines that you take. ? If told by your doctor, stop taking aspirin or medicines that have aspirin in them. These medicines can make you bleed more.  You may be given iron pills to replace iron that your body loses because of this condition. Take them as told by your doctor. Managing constipation If you are taking iron pills, you may have trouble pooping (constipation). To prevent or treat trouble pooping, you may need to:  Drink enough fluid to keep your pee (urine) pale yellow.  Take over-the-counter or prescription medicines.  Eat foods that are high in fiber. These include beans, whole grains, and fresh fruits and vegetables.  Limit foods that are high in fat and sugar. These include fried or sweet foods. General instructions  Watch your condition for any changes.  Do not use tampons, douche, or have sex, if your doctor tells you not to.  Change your pads often.  Get regular exams. This includes pelvic exams and cervical cancer screenings. ? It is up to you to get the results of any tests that are done. Ask your doctor, or the department that is doing the tests, when your results will be ready.  Keep all follow-up visits as told by your doctor. This is important. Contact a doctor if:  The bleeding lasts more than 1  week.  You feel dizzy at times.  You feel like you may vomit (nausea).  You vomit.  You feel light-headed or weak.  Your symptoms get worse. Get help right away if:  You pass out.  You have to change pads every hour.  You have pain in your belly.  You have a fever or chills.  You get sweaty.  You get weak.  You pass large blood clots from your vagina. Summary  Abnormal uterine bleeding means bleeding more than usual from your womb (uterus).  Any kind of bleeding that is not normal should be checked by a doctor.  Treatment depends on the cause of the bleeding.  Get help right away if you pass out, you have to change pads every hour, or you pass large blood clots from your vagina. This information is not intended to replace advice given to you by your health care provider. Make sure you discuss any questions you have with your health care provider. Document Revised: 05/18/2019 Document Reviewed: 05/18/2019 [employer] Patient Education  2021 [employer]  

## 2021-01-03 NOTE — Progress Notes (Signed)
GYN ENCOUNTER NOTE  Subjective:       Denise Contreras is a 30 y.o. [id number] female is here for gynecologic evaluation of the following issues:  1. Irregular menstrual cycle. PT states she has bleeding every other week for 1-2 days monthly. States this started 3-4 months ago . She denies any recent changes other than trying to loses weight. She has been eating better and exercising. She has lost 13 lbs.    Gynecologic History No LMP recorded. Contraception: none Last Pap: not sure  Last mammogram: n/a .   Obstetric History OB History  Gravida Para Term Preterm AB Living  2 2 2     2   SAB IAB Ectopic Multiple Live Births               # Outcome Date GA Lbr Len/2nd Weight Sex Delivery Anes PTL Lv  2 Term           1 Term             Past Medical History:  Diagnosis Date  . Allergy   . Asthma   . Depression   . Hypertension     Past Surgical History:  Procedure Laterality Date  . CESAREAN SECTION      Current Outpatient Medications on File Prior to Visit  Medication Sig Dispense Refill  . albuterol (VENTOLIN HFA) 108 (90 Base) MCG/ACT inhaler Inhale 1-2 puffs into the lungs every 6 (six) hours as needed for wheezing or shortness of breath. 8 g 0  . Biotin 5 MG TBDP     . buPROPion (WELLBUTRIN SR) 150 MG 12 hr tablet Take 1 tablet (150 mg total) by mouth 2 (two) times daily. 60 tablet 0  . cetirizine (ZYRTEC) 10 MG tablet Take 1 tablet (10 mg total) by mouth daily. 30 tablet 11  . lisinopril (ZESTRIL) 20 MG tablet Take 1 tablet (20 mg total) by mouth daily. 90 tablet 1  . Multiple Vitamin (MULTIVITAMIN) tablet Take 1 tablet by mouth daily.    . meloxicam (MOBIC) 15 MG tablet Take 1 tablet (15 mg total) by mouth daily. (Patient not taking: No sig reported) 30 tablet 3  . Vitamin D, Ergocalciferol, (DRISDOL) 1.25 MG (50000 UNIT) CAPS capsule Take 1 capsule (50,000 Units total) by mouth every 7 (seven) days. (taking one tablet per week) walk in lab in office 1-2 weeks after  completing prescription. Discontinue over the counter vitamin D while taking this. (Patient not taking: Reported on 01/03/2021) 12 capsule 0   No current facility-administered medications on file prior to visit.    Allergies  Allergen Reactions  . Sulfa Antibiotics   . Nickel Rash    Social History   Socioeconomic History  . Marital status: Single    Spouse name: Not on file  . Number of children: Not on file  . Years of education: Not on file  . Highest education level: Not on file  Occupational History  . Not on file  Tobacco Use  . Smoking status: Never Smoker  . Smokeless tobacco: Never Used  Vaping Use  . Vaping Use: Never used  Substance and Sexual Activity  . Alcohol use: Not Currently  . Drug use: Never  . Sexual activity: Yes  Other Topics Concern  . Not on file  Social History Narrative  . Not on file   Social Determinants of Health   Financial Resource Strain: Not on file  Food Insecurity: Not on file  Transportation Needs: Not on  file  Physical Activity: Not on file  Stress: Not on file  Social Connections: Not on file  Intimate Partner Violence: Not on file    Family History  Problem Relation Age of Onset  . COPD Mother   . Hypertension Mother   . Alcohol abuse Mother   . Cancer Father   . Hypertension Father   . Alcohol abuse Paternal Uncle   . Alcohol abuse Maternal Grandmother   . COPD Maternal Grandmother   . Alcohol abuse Maternal Grandfather   . Hypertension Paternal Grandmother   . Stroke Paternal Grandmother   . Cancer Paternal Grandfather     The following portions of the patient's history were reviewed and updated as appropriate: allergies, current medications, past family history, past medical history, past social history, past surgical history and problem list.  Review of Systems Review of Systems - Negative except as mentioned in HPI Review of Systems - General ROS: negative for - chills, fatigue, fever, hot flashes, malaise or  night sweats Hematological and Lymphatic ROS: negative for - bleeding problems or swollen lymph nodes Gastrointestinal ROS: negative for - abdominal pain, blood in stools, change in bowel habits and nausea/vomiting Musculoskeletal ROS: negative for - joint pain, muscle pain or muscular weakness Genito-Urinary ROS: negative for - change in menstrual cycle, dysmenorrhea, dyspareunia, dysuria, genital discharge, genital ulcers, hematuria, incontinence, irregular/heavy menses, nocturia or pelvic pain.  Objective:   BP 138/81   Pulse 81   Ht 5\' 3"  (1.6 m)   Wt (!) 337 lb 14.4 oz (153.3 kg)   BMI 59.86 kg/m  CONSTITUTIONAL: Well-developed, well-nourished female in no acute distress.  HENT:  Normocephalic, atraumatic.  NECK: Normal range of motion, supple, no masses.  Normal thyroid.  SKIN: Skin is warm and dry. No rash noted. Not diaphoretic. No erythema. No pallor. [place]: Alert and oriented to person, place, and time. PSYCHIATRIC: Normal mood and affect. Normal behavior. Normal judgment and thought content. CARDIOVASCULAR:Not Examined RESPIRATORY: Not Examined BREASTS: Not Examined ABDOMEN: Soft, non distended; Non tender.  No Organomegaly. PELVIC:not indicated, pelvic u/s ordered MUSCULOSKELETAL: Normal range of motion. No tenderness.  No cyanosis, clubbing, or edema.     Assessment:   Irregular menses Obesity    Plan:   Discussed possible causes of vaginal spotting/bleeding including infection, fibroids, polyps. Discussed management with BC . Pt is interested in [facility] options. We reviewed: Pill, patch, Ring, IUD, Nexplanon and depo injections. She would prefer not to have a periods and is interested in the [place]. Discussed procedure for placement and removal. Discussed irregular bleeding likely in the first 3-6 months. She verbalizes and agrees to plan. She will return for placement PRN.   Face to face time [place], CNM

## 2021-01-03 NOTE — Progress Notes (Signed)
Pt presents for irregular periods, wants to discuss birth control options for regularity and contraceptive use.

## 2021-01-04 ENCOUNTER — Telehealth: Payer: Self-pay | Admitting: Certified Nurse Midwife

## 2021-01-04 ENCOUNTER — Other Ambulatory Visit: Payer: Self-pay | Admitting: Certified Nurse Midwife

## 2021-01-04 MED ORDER — NORETHINDRONE ACET-ETHINYL EST 1.5-30 MG-MCG PO TABS: 1.0000 | ORAL_TABLET | Freq: Every day | ORAL | 11 refills | Status: DC

## 2021-01-04 NOTE — Progress Notes (Signed)
Pt called and stated after doing her own researched she has decided not to use nexplanon and would like to use the pill instead. The pill was reviewed in our visit yesterday. She declines any contraindication to use. Orders placed.   Philip Aspen, CNM

## 2021-01-04 NOTE — Telephone Encounter (Signed)
Patient stated that after looking at the video for the Nexplanon she has decided to not go that route and she wants to start taking birth control pills. She stated that bc was discussed in her visit yesterday with Deneise Lever

## 2021-01-05 ENCOUNTER — Other Ambulatory Visit: Payer: Self-pay | Admitting: Certified Nurse Midwife

## 2021-01-05 LAB — CERVICOVAGINAL ANCILLARY ONLY
Bacterial Vaginitis (gardnerella): POSITIVE — AB
Candida Glabrata: NEGATIVE
Candida Vaginitis: NEGATIVE
Chlamydia: NEGATIVE
Comment: NEGATIVE
Comment: NEGATIVE
Comment: NEGATIVE
Comment: NEGATIVE
Comment: NEGATIVE
Comment: NORMAL
Neisseria Gonorrhea: NEGATIVE
Trichomonas: NEGATIVE

## 2021-01-05 MED ORDER — METRONIDAZOLE 500 MG PO TABS: 500.0000 mg | ORAL_TABLET | Freq: Two times a day (BID) | ORAL | 0 refills | Status: AC

## 2021-01-05 NOTE — Progress Notes (Signed)
Vaginal swab positive for BV, orders placed for treatment.   Philip Aspen, CNM

## 2021-01-09 ENCOUNTER — Other Ambulatory Visit: Payer: Self-pay | Admitting: Adult Health

## 2021-01-09 NOTE — Telephone Encounter (Signed)
Hi Denise Contreras,   I hope your doing well, we miss you!!!   I'm not sure what to do with this pt.  It looks like she no showed an appointment with you 12/29/2020.     Thanks,   -Mickel Baas

## 2021-01-10 ENCOUNTER — Other Ambulatory Visit: Payer: Self-pay | Admitting: Adult Health

## 2021-01-10 NOTE — Telephone Encounter (Signed)
Copied from [place] [id number]. Topic: Quick Communication - Rx Refill/Question >> Jan 10, 2021  1:46 PM Lenon Curt, [place] A wrote: Medication: buPROPion (WELLBUTRIN SR) 150 MG 12 hr tablet   Has the patient contacted their pharmacy? Yes.   (Agent: If no, request that the patient contact the pharmacy for the refill.) (Agent: If yes, when and what did the pharmacy advise?)  Preferred Pharmacy (with phone number or street name): [facility] [place] - [place]  Phone:  [phone] Fax:  [phone]  Agent: Please be advised that RX refills may take up to 3 business days. We ask that you follow-up with your pharmacy.

## 2021-01-11 MED ORDER — BUPROPION HCL ER (SR) 150 MG PO TB12: 150.0000 mg | ORAL_TABLET | Freq: Two times a day (BID) | ORAL | 0 refills | Status: DC

## 2021-01-11 NOTE — Telephone Encounter (Signed)
Gave 30 day supply seeing Vernie Murders PA-C on 02/01/21  This provider no longer at [facility] gave bridge until appointment.

## 2021-01-16 ENCOUNTER — Other Ambulatory Visit: Payer: Self-pay | Admitting: Adult Health

## 2021-01-16 ENCOUNTER — Encounter: Payer: Medicaid Other | Admitting: Certified Nurse Midwife

## 2021-01-16 DIAGNOSIS — E559 Vitamin D deficiency, unspecified: Secondary | ICD-10-CM

## 2021-01-26 DIAGNOSIS — Z419 Encounter for procedure for purposes other than remedying health state, unspecified: Secondary | ICD-10-CM | POA: Diagnosis not present

## 2021-01-31 ENCOUNTER — Ambulatory Visit: Payer: Medicaid Other | Admitting: Podiatry

## 2021-02-01 ENCOUNTER — Ambulatory Visit (INDEPENDENT_AMBULATORY_CARE_PROVIDER_SITE_OTHER): Payer: Medicaid Other | Admitting: Family Medicine

## 2021-02-01 ENCOUNTER — Other Ambulatory Visit: Payer: Self-pay

## 2021-02-01 DIAGNOSIS — Z5329 Procedure and treatment not carried out because of patient's decision for other reasons: Secondary | ICD-10-CM

## 2021-02-01 NOTE — Progress Notes (Signed)
Visit marked at cancelled.

## 2021-02-02 ENCOUNTER — Telehealth: Payer: Medicaid Other | Admitting: Family Medicine

## 2021-02-02 ENCOUNTER — Other Ambulatory Visit: Payer: Self-pay

## 2021-02-02 DIAGNOSIS — Z5329 Procedure and treatment not carried out because of patient's decision for other reasons: Secondary | ICD-10-CM

## 2021-02-02 MED ORDER — BUPROPION HCL ER (SR) 150 MG PO TB12: 150.0000 mg | ORAL_TABLET | Freq: Two times a day (BID) | ORAL | 3 refills | Status: DC

## 2021-02-02 MED ORDER — ALBUTEROL SULFATE HFA 108 (90 BASE) MCG/ACT IN AERS: 1.0000 | INHALATION_SPRAY | Freq: Four times a day (QID) | RESPIRATORY_TRACT | 2 refills | Status: DC | PRN

## 2021-02-02 NOTE — Progress Notes (Signed)
Appointment cancelled, no visit.

## 2021-02-02 NOTE — Telephone Encounter (Signed)
Patient was unable to be seen by provider. She will call back to reschedule. Patient is requesting a refill on medications to last until next appt. Please advise. Thanks!

## 2021-02-05 ENCOUNTER — Ambulatory Visit: Payer: Medicaid Other

## 2021-02-07 ENCOUNTER — Other Ambulatory Visit: Payer: Self-pay

## 2021-02-07 ENCOUNTER — Ambulatory Visit: Payer: Self-pay | Admitting: *Deleted

## 2021-02-07 ENCOUNTER — Ambulatory Visit
Admission: EM | Admit: 2021-02-07 | Discharge: 2021-02-07 | Disposition: A | Discharge status: Home / Self Care | Payer: Medicaid Other | Attending: Family Medicine | Admitting: Family Medicine

## 2021-02-07 ENCOUNTER — Ambulatory Visit: Payer: Medicaid Other | Admitting: Podiatry

## 2021-02-07 DIAGNOSIS — Z882 Allergy status to sulfonamides status: Secondary | ICD-10-CM | POA: Insufficient documentation

## 2021-02-07 DIAGNOSIS — Z79899 Other long term (current) drug therapy: Secondary | ICD-10-CM | POA: Insufficient documentation

## 2021-02-07 DIAGNOSIS — Z20822 Contact with and (suspected) exposure to covid-19: Secondary | ICD-10-CM | POA: Diagnosis not present

## 2021-02-07 DIAGNOSIS — J040 Acute laryngitis: Secondary | ICD-10-CM | POA: Diagnosis not present

## 2021-02-07 DIAGNOSIS — R519 Headache, unspecified: Secondary | ICD-10-CM | POA: Insufficient documentation

## 2021-02-07 DIAGNOSIS — H9203 Otalgia, bilateral: Secondary | ICD-10-CM | POA: Insufficient documentation

## 2021-02-07 DIAGNOSIS — J029 Acute pharyngitis, unspecified: Secondary | ICD-10-CM | POA: Diagnosis not present

## 2021-02-07 LAB — SARS CORONAVIRUS 2 (TAT 6-24 HRS): SARS Coronavirus 2: NEGATIVE

## 2021-02-07 LAB — GROUP A STREP BY PCR: Group A Strep by PCR: NOT DETECTED

## 2021-02-07 MED ORDER — LIDOCAINE VISCOUS HCL 2 % MT SOLN: 15.0000 mL | OROMUCOSAL | 0 refills | Status: DC | PRN

## 2021-02-07 MED ORDER — PREDNISONE 10 MG PO TABS: 30.0000 mg | ORAL_TABLET | Freq: Every day | ORAL | 0 refills | Status: AC

## 2021-02-07 NOTE — Telephone Encounter (Signed)
Reason for Disposition  [1] Sore throat with cough/cold symptoms AND [2] present < 5 days    Pt prefers to go to the urgent care today.  Answer Assessment - Initial Assessment Questions 1. ONSET: "When did the throat start hurting?" (Hours or days ago)     Sore throat, body aches, fever, chills this morning.   I tested myself for Covid yesterday and this morning and it's negative results.  Pt has an appt to be established as a new pt with [facility] on Aug. 11, 2022 so  have recommended she go to the urgent care until she is established.   She was agreeable to this plan.   Complete triage not done since we can't see her in [place] today until she is established. 2. SEVERITY: "How bad is the sore throat?" (Scale 1-10; mild, moderate or severe)   - MILD (1-3):  doesn't interfere with eating or normal activities   - MODERATE (4-7): interferes with eating some solids and normal activities   - SEVERE (8-10):  excruciating pain, interferes with most normal activities   - SEVERE DYSPHAGIA: can't swallow liquids, drooling     *No Answer* 3. STREP EXPOSURE: "Has there been any exposure to strep within the past week?" If Yes, ask: "What type of contact occurred?"      *No Answer* 4.  VIRAL SYMPTOMS: "Are there any symptoms of a cold, such as a runny nose, cough, hoarse voice or red eyes?"      *No Answer* 5. FEVER: "Do you have a fever?" If Yes, ask: "What is your temperature, how was it measured, and when did it start?"     *No Answer* 6. PUS ON THE TONSILS: "Is there pus on the tonsils in the back of your throat?"     *No Answer* 7. OTHER SYMPTOMS: "Do you have any other symptoms?" (e.g., difficulty breathing, headache, rash)     *No Answer* 8. PREGNANCY: "Is there any chance you are pregnant?" "When was your last menstrual period?"     *No Answer*  Protocols used: Sore Throat-A-AH

## 2021-02-07 NOTE — ED Provider Notes (Signed)
[facility]    CSN: [id number] Arrival date & time: 02/07/21  1006      History   Chief Complaint Chief Complaint  Patient presents with   Sore Throat    HPI Denise Contreras is a 30 y.o. female presenting for onset of sore throat, painful swallowing, voice hoarseness, bilateral ear pain, headache and backache yesterday.  Patient denies any fevers, fatigue, congestion or cough.  No shortness of breath, no abdominal pain, and no nausea/vomiting/diarrhea.  Patient denies any sick contacts and no known exposure to COVID-19.  Vaccinated for COVID-19 x2.  Did have 2 negative at home COVID test.  Patient not taking any OTC meds for symptoms.  She has no other complaints or concerns.  HPI  Past Medical History:  Diagnosis Date   Allergy    Asthma    Depression    Hypertension     Patient Active Problem List   Diagnosis Date Noted   Irregular menses 01/03/2021   Moderate persistent asthma without complication 35/57/3220   Vitamin D insufficiency 07/05/2020   Encounter for gynecological examination 07/05/2020   Depression, major, single episode, moderate ([place]) 07/05/2020   Lumbar pain 07/05/2020   Allergic rhinitis 11/24/2019   Mild intermittent asthma 11/24/2019   Essential hypertension 10/28/2019   BMI 60.0-69.9, adult ([place]) 10/28/2019   Asthma 08/19/2018   Fracture of manubrium, initial encounter for closed fracture 06/20/2018    Past Surgical History:  Procedure Laterality Date   CESAREAN SECTION      OB History     Gravida  2   Para  2   Term  2   Preterm      AB      Living  2      SAB      IAB      Ectopic      Multiple      Live Births               Home Medications    Prior to Admission medications   Medication Sig Start Date End Date Taking? Authorizing Provider  lidocaine (XYLOCAINE) 2 % solution Use as directed 15 mLs in the mouth or throat as needed for mouth pain (swish and spit). 02/07/21  Yes Danton Clap,  PA-C  predniSONE (DELTASONE) 10 MG tablet Take 3 tablets (30 mg total) by mouth daily for 5 days. 02/07/21 02/12/21 Yes Danton Clap, PA-C  albuterol (VENTOLIN HFA) 108 (90 Base) MCG/ACT inhaler Inhale 1-2 puffs into the lungs every 6 (six) hours as needed for wheezing or shortness of breath. 02/02/21   Chrismon, Vickki Muff, PA-C  Biotin 5 MG TBDP  09/14/20   [provider]  buPROPion (WELLBUTRIN SR) 150 MG 12 hr tablet Take 1 tablet (150 mg total) by mouth 2 (two) times daily. 02/02/21   Chrismon, Vickki Muff, PA-C  cetirizine (ZYRTEC) 10 MG tablet Take 1 tablet (10 mg total) by mouth daily. 10/03/20   Flinchum, Kelby Aline, FNP  lisinopril (ZESTRIL) 20 MG tablet Take 1 tablet (20 mg total) by mouth daily. 10/03/20   Flinchum, Kelby Aline, FNP  meloxicam (MOBIC) 15 MG tablet Take 1 tablet (15 mg total) by mouth daily. Patient not taking: No sig reported 11/01/20   Criselda Peaches, DPM  Multiple Vitamin (MULTIVITAMIN) tablet Take 1 tablet by mouth daily.    [provider]  Norethindrone Acetate-Ethinyl Estradiol (JUNEL 1.5/30) 1.5-30 MG-MCG tablet Take 1 tablet by mouth daily. 01/04/21  Philip Aspen, CNM  Vitamin D, Ergocalciferol, (DRISDOL) 1.25 MG (50000 UNIT) CAPS capsule Take 1 capsule (50,000 Units total) by mouth every 7 (seven) days. (taking one tablet per week) walk in lab in office 1-2 weeks after completing prescription. Discontinue over the counter vitamin D while taking this. Patient not taking: No sig reported 12/18/20   Flinchum, Kelby Aline, FNP    Family History Family History  Problem Relation Age of Onset   COPD Mother    Hypertension Mother    Alcohol abuse Mother    Cancer Father    Hypertension Father    Alcohol abuse Paternal Uncle    Alcohol abuse Maternal Grandmother    COPD Maternal Grandmother    Alcohol abuse Maternal Grandfather    Hypertension Paternal Grandmother    Stroke Paternal Grandmother    Cancer Paternal Grandfather     Social History Social  History   Tobacco Use   Smoking status: Never   Smokeless tobacco: Never  Vaping Use   Vaping Use: Never used  Substance Use Topics   Alcohol use: Not Currently   Drug use: Never     Allergies   Sulfa antibiotics and Nickel   Review of Systems Review of Systems  Constitutional:  Negative for chills, diaphoresis, fatigue and fever.  HENT:  Positive for ear pain, sore throat and voice change. Negative for congestion, rhinorrhea, sinus pressure and sinus pain.   Respiratory:  Negative for cough and shortness of breath.   Gastrointestinal:  Negative for abdominal pain, nausea and vomiting.  Musculoskeletal:  Positive for back pain. Negative for arthralgias and myalgias.  Skin:  Negative for rash.  Neurological:  Positive for headaches. Negative for weakness.  Hematological:  Negative for adenopathy.    Physical Exam Triage Vital Signs ED Triage Vitals [02/07/21 1047]  Enc Vitals Group     BP (!) 172/103     Pulse Rate 89     Resp 18     Temp 99 F (37.2 C)     Temp Source Oral     SpO2 99 %     Weight (!) 320 lb (145.2 kg)     Height 5\' 3"  (1.6 m)     Head Circumference      Peak Flow      Pain Score 5     Pain Loc      Pain Edu?      Excl. in [place]?    No data found.  Updated Vital Signs BP 128/85   Pulse 87   Temp 99 F (37.2 C) (Oral)   Resp 18   Ht 5\' 3"  (1.6 m)   Wt (!) 320 lb (145.2 kg)   LMP  (LMP Unknown)   SpO2 99%   BMI 56.69 kg/m       Physical Exam Vitals and nursing note reviewed.  Constitutional:      General: She is not in acute distress.    Appearance: Normal appearance. She is well-developed. She is obese. She is not ill-appearing or toxic-appearing.  HENT:     Head: Normocephalic and atraumatic.     Right Ear: Tympanic membrane, ear canal and external ear normal.     Left Ear: Tympanic membrane, ear canal and external ear normal.     Nose: Nose normal.     Mouth/Throat:     Mouth: Mucous membranes are moist.     Pharynx:  Oropharynx is clear. Posterior oropharyngeal erythema present.     Tonsils: 1+  on the right. 1+ on the left.  Eyes:     General: No scleral icterus.       Right eye: No discharge.        Left eye: No discharge.     Conjunctiva/sclera: Conjunctivae normal.  Cardiovascular:     Rate and Rhythm: Normal rate and regular rhythm.     Heart sounds: Normal heart sounds.  Pulmonary:     Effort: Pulmonary effort is normal. No respiratory distress.     Breath sounds: Normal breath sounds.  Musculoskeletal:     Cervical back: Neck supple.  Lymphadenopathy:     Cervical: Cervical adenopathy present.  Skin:    General: Skin is dry.  Neurological:     General: No focal deficit present.     Mental Status: She is alert. Mental status is at baseline.     Motor: No weakness.     Gait: Gait normal.  Psychiatric:        Mood and Affect: Mood normal.        Behavior: Behavior normal.        Thought Content: Thought content normal.     UC Treatments / Results  Labs (all labs ordered are listed, but only abnormal results are displayed) Labs Reviewed  GROUP A STREP BY PCR  SARS CORONAVIRUS 2 (TAT 6-24 HRS)    EKG   Radiology No results found.  Procedures Procedures (including critical care time)  Medications Ordered in UC Medications - No data to display  Initial Impression / Assessment and Plan / UC Course  I have reviewed the triage vital signs and the nursing notes.  Pertinent labs & imaging results that were available during my care of the patient were reviewed by me and considered in my medical decision making (see chart for details).  30 year old female presenting for sore throat, painful swallowing, voice hoarseness, headaches, ear pain and back pain.  Vital signs are normal and stable.  Exam significant for posterior pharyngeal erythema and 1+ bilateral tonsillar swelling without exudates.  PCR strep test is negative.  COVID test obtained.  Current CDC guidelines,  isolation protocol and ED precautions reviewed with patient.  Advised that she has viral pharyngitis and viral laryngitis.  Supportive care advised at this time with increasing rest and fluids.  Sent prednisone to help with the tonsillar swelling and laryngitis.  Also sent viscous lidocaine.  Advised follow-up as needed.   Final Clinical Impressions(s) / UC Diagnoses   Final diagnoses:  Acute laryngitis  Sore throat     Discharge Instructions      URI/COLD SYMPTOMS: Your exam today is consistent with a viral illness. Antibiotics are not indicated at this time. Use medications as directed, including cough syrup, nasal saline, and decongestants. Your symptoms should improve over the next few days and resolve within 7-10 days. Increase rest and fluids. F/u if symptoms worsen or predominate such as sore throat, ear pain, productive cough, shortness of breath, or if you develop high fevers or worsening fatigue over the next several days.    You have received COVID testing today either for positive exposure, concerning symptoms that could be related to COVID infection, screening purposes, or re-testing after confirmed positive.  Your test obtained today checks for active viral infection in the last 1-2 weeks. If your test is negative now, you can still test positive later. So, if you do develop symptoms you should either get re-tested and/or isolate x 5 days and then strict mask use x  5 days (unvaccinated) or mask use x 10 days (vaccinated). Please follow CDC guidelines.  While Rapid antigen tests come back in 15-20 minutes, send out PCR/molecular test results typically come back within 1-3 days. In the mean time, if you are symptomatic, assume this could be a positive test and treat/monitor yourself as if you do have COVID.   We will call with test results if positive. Please download the MyChart app and set up a profile to access test results.   If symptomatic, go home and rest. Push fluids. Take  Tylenol as needed for discomfort. Gargle warm salt water. Throat lozenges. Take Mucinex DM or Robitussin for cough. Humidifier in bedroom to ease coughing. Warm showers. Also review the COVID handout for more information.  COVID-19 INFECTION: The incubation period of COVID-19 is approximately 14 days after exposure, with most symptoms developing in roughly 4-5 days. Symptoms may range in severity from mild to critically severe. Roughly 80% of those infected will have mild symptoms. People of any age may become infected with COVID-19 and have the ability to transmit the virus. The most common symptoms include: fever, fatigue, cough, body aches, headaches, sore throat, nasal congestion, shortness of breath, nausea, vomiting, diarrhea, changes in smell and/or taste.    COURSE OF ILLNESS Some patients may begin with mild disease which can progress quickly into critical symptoms. If your symptoms are worsening please call ahead to the Emergency Department and proceed there for further treatment. Recovery time appears to be roughly 1-2 weeks for mild symptoms and 3-6 weeks for severe disease.   GO IMMEDIATELY TO ER FOR FEVER YOU ARE UNABLE TO GET DOWN WITH TYLENOL, BREATHING PROBLEMS, CHEST PAIN, FATIGUE, LETHARGY, INABILITY TO EAT OR DRINK, ETC  QUARANTINE AND ISOLATION: To help decrease the spread of COVID-19 please remain isolated if you have COVID infection or are highly suspected to have COVID infection. This means -stay home and isolate to one room in the home if you live with others. Do not share a bed or bathroom with others while ill, sanitize and wipe down all countertops and keep common areas clean and disinfected. Stay home for 5 days. If you have no symptoms or your symptoms are resolving after 5 days, you can leave your house. Continue to wear a mask around others for 5 additional days. If you have been in close contact (within 6 feet) of someone diagnosed with COVID 19, you are advised to  quarantine in your home for 14 days as symptoms can develop anywhere from 2-14 days after exposure to the virus. If you develop symptoms, you  must isolate.  Most current guidelines for COVID after exposure -unvaccinated: isolate 5 days and strict mask use x 5 days. Test on day 5 is possible -vaccinated: wear mask x 10 days if symptoms do not develop -You do not necessarily need to be tested for COVID if you have + exposure and  develop symptoms. Just isolate at home x10 days from symptom onset During this global pandemic, CDC advises to practice social distancing, try to stay at least 21ft away from others at all times. Wear a face covering. Wash and sanitize your hands regularly and avoid going anywhere that is not necessary.  KEEP IN MIND THAT THE COVID TEST IS NOT 100% ACCURATE AND YOU SHOULD STILL DO EVERYTHING TO PREVENT POTENTIAL SPREAD OF VIRUS TO OTHERS (WEAR MASK, WEAR GLOVES, [place] HANDS AND SANITIZE REGULARLY). IF INITIAL TEST IS NEGATIVE, THIS MAY NOT MEAN YOU ARE DEFINITELY NEGATIVE. MOST  ACCURATE TESTING IS DONE 5-7 DAYS AFTER EXPOSURE.   It is not advised by CDC to get re-tested after receiving a positive COVID test since you can still test positive for weeks to months after you have already cleared the virus.   *If you have not been vaccinated for COVID, I strongly suggest you consider getting vaccinated as long as there are no contraindications.    ELEVATED BP: Blood pressure elevated in clinic today.  Keep an eye on this and if it is consistently greater than 140/90 or blood pressure medication may need to be adjusted.  Follow-up with primary care provider regarding this.     ED Prescriptions     Medication Sig Dispense Auth. Provider   predniSONE (DELTASONE) 10 MG tablet Take 3 tablets (30 mg total) by mouth daily for 5 days. 15 tablet Laurene Footman B, PA-C   lidocaine (XYLOCAINE) 2 % solution Use as directed 15 mLs in the mouth or throat as needed for mouth pain (swish and  spit). 100 mL Danton Clap, PA-C      PDMP not reviewed this encounter.   Danton Clap, PA-C 02/07/21 1236

## 2021-02-07 NOTE — ED Triage Notes (Signed)
Pt reports having sore throat, bila ear pain and back pain that began yesterday.  Neg home covid test yesterday and today.

## 2021-02-07 NOTE — Discharge Instructions (Addendum)
URI/COLD SYMPTOMS: Your exam today is consistent with a viral illness. Antibiotics are not indicated at this time. Use medications as directed, including cough syrup, nasal saline, and decongestants. Your symptoms should improve over the next few days and resolve within 7-10 days. Increase rest and fluids. F/u if symptoms worsen or predominate such as sore throat, ear pain, productive cough, shortness of breath, or if you develop high fevers or worsening fatigue over the next several days.    You have received COVID testing today either for positive exposure, concerning symptoms that could be related to COVID infection, screening purposes, or re-testing after confirmed positive.  Your test obtained today checks for active viral infection in the last 1-2 weeks. If your test is negative now, you can still test positive later. So, if you do develop symptoms you should either get re-tested and/or isolate x 5 days and then strict mask use x 5 days (unvaccinated) or mask use x 10 days (vaccinated). Please follow CDC guidelines.  While Rapid antigen tests come back in 15-20 minutes, send out PCR/molecular test results typically come back within 1-3 days. In the mean time, if you are symptomatic, assume this could be a positive test and treat/monitor yourself as if you do have COVID.   We will call with test results if positive. Please download the MyChart app and set up a profile to access test results.   If symptomatic, go home and rest. Push fluids. Take Tylenol as needed for discomfort. Gargle warm salt water. Throat lozenges. Take Mucinex DM or Robitussin for cough. Humidifier in bedroom to ease coughing. Warm showers. Also review the COVID handout for more information.  COVID-19 INFECTION: The incubation period of COVID-19 is approximately 14 days after exposure, with most symptoms developing in roughly 4-5 days. Symptoms may range in severity from mild to critically severe. Roughly 80% of those infected  will have mild symptoms. People of any age may become infected with COVID-19 and have the ability to transmit the virus. The most common symptoms include: fever, fatigue, cough, body aches, headaches, sore throat, nasal congestion, shortness of breath, nausea, vomiting, diarrhea, changes in smell and/or taste.    COURSE OF ILLNESS Some patients may begin with mild disease which can progress quickly into critical symptoms. If your symptoms are worsening please call ahead to the Emergency Department and proceed there for further treatment. Recovery time appears to be roughly 1-2 weeks for mild symptoms and 3-6 weeks for severe disease.   GO IMMEDIATELY TO ER FOR FEVER YOU ARE UNABLE TO GET DOWN WITH TYLENOL, BREATHING PROBLEMS, CHEST PAIN, FATIGUE, LETHARGY, INABILITY TO EAT OR DRINK, ETC  QUARANTINE AND ISOLATION: To help decrease the spread of COVID-19 please remain isolated if you have COVID infection or are highly suspected to have COVID infection. This means -stay home and isolate to one room in the home if you live with others. Do not share a bed or bathroom with others while ill, sanitize and wipe down all countertops and keep common areas clean and disinfected. Stay home for 5 days. If you have no symptoms or your symptoms are resolving after 5 days, you can leave your house. Continue to wear a mask around others for 5 additional days. If you have been in close contact (within 6 feet) of someone diagnosed with COVID 19, you are advised to quarantine in your home for 14 days as symptoms can develop anywhere from 2-14 days after exposure to the virus. If you develop symptoms, you  must isolate.  Most current guidelines for COVID after exposure -unvaccinated: isolate 5 days and strict mask use x 5 days. Test on day 5 is possible -vaccinated: wear mask x 10 days if symptoms do not develop -You do not necessarily need to be tested for COVID if you have + exposure and  develop symptoms. Just isolate at  home x10 days from symptom onset During this global pandemic, CDC advises to practice social distancing, try to stay at least 32ft away from others at all times. Wear a face covering. Wash and sanitize your hands regularly and avoid going anywhere that is not necessary.  KEEP IN MIND THAT THE COVID TEST IS NOT 100% ACCURATE AND YOU SHOULD STILL DO EVERYTHING TO PREVENT POTENTIAL SPREAD OF VIRUS TO OTHERS (WEAR MASK, WEAR GLOVES, [place] HANDS AND SANITIZE REGULARLY). IF INITIAL TEST IS NEGATIVE, THIS MAY NOT MEAN YOU ARE DEFINITELY NEGATIVE. MOST ACCURATE TESTING IS DONE 5-7 DAYS AFTER EXPOSURE.   It is not advised by CDC to get re-tested after receiving a positive COVID test since you can still test positive for weeks to months after you have already cleared the virus.   *If you have not been vaccinated for COVID, I strongly suggest you consider getting vaccinated as long as there are no contraindications.    ELEVATED BP: Blood pressure elevated in clinic today.  Keep an eye on this and if it is consistently greater than 140/90 or blood pressure medication may need to be adjusted.  Follow-up with primary care provider regarding this.

## 2021-02-11 ENCOUNTER — Other Ambulatory Visit: Payer: Self-pay | Admitting: Adult Health

## 2021-02-14 ENCOUNTER — Ambulatory Visit: Payer: Medicaid Other

## 2021-02-26 ENCOUNTER — Other Ambulatory Visit: Payer: Self-pay

## 2021-02-26 ENCOUNTER — Encounter: Payer: Self-pay | Admitting: Certified Nurse Midwife

## 2021-02-26 ENCOUNTER — Ambulatory Visit (INDEPENDENT_AMBULATORY_CARE_PROVIDER_SITE_OTHER): Payer: Medicaid Other | Admitting: Certified Nurse Midwife

## 2021-02-26 VITALS — BP 149/95 | HR 79 | Ht 63.0 in | Wt 339.5 lb

## 2021-02-26 DIAGNOSIS — Z3009 Encounter for other general counseling and advice on contraception: Secondary | ICD-10-CM | POA: Diagnosis not present

## 2021-02-26 NOTE — Progress Notes (Signed)
Subjective:    Denise Contreras is a 30 y.o. female who presents for contraception counseling. The patient has no complaints today. The patient is sexually active. Pertinent past medical history: none. She states she is done with child bearing and is interested in a tubal ligation. She states she is 100% sure of this and declines use of alternative BC options.   Menstrual History: OB History     Gravida  2   Para  2   Term  2   Preterm      AB      Living  2      SAB      IAB      Ectopic      Multiple      Live Births             Patient's last menstrual period was 02/23/2021. Period Pattern: (!) Irregular Menstrual Flow: Light Dysmenorrhea: None  The following portions of the patient's history were reviewed and updated as appropriate: allergies, current medications, past family history, past medical history, past social history, past surgical history, and problem list.  Review of Systems Pertinent items are noted in HPI.   Objective:    No exam performed today,  not indicated to discuss birth control options .   Assessment:    30 y.o.,  requesting BTL. Discussed that she would need to see MD for consult and scheduling of procedure. Tubal consent signed today. Scanned to chart. Pt to follow up with MD.   Face to face time 10 min.    All questions answered.

## 2021-03-05 NOTE — Progress Notes (Signed)
Established patient visit   Patient: Denise Contreras   DOB: Jul 22, 1991   30 y.o. Female  MRN: [id number] Visit Date: 03/06/2021  Today's healthcare provider: Vernie Murders, PA-C   No chief complaint on file.  Subjective    HPI  Hypertension, follow-up  BP Readings from Last 3 Encounters:  03/06/21 (!) 143/103  02/26/21 (!) 149/95  02/07/21 128/85   Wt Readings from Last 3 Encounters:  03/06/21 (!) 344 lb (156 kg)  02/26/21 (!) 339 lb 8 oz (154 kg)  02/07/21 (!) 320 lb (145.2 kg)     She was last seen for hypertension 5 months ago.  BP at that visit was as above. Management since that visit includes none.  She reports  fair compliance with treatment. She is having side effects.  Patinet has not taken her medications for a feww days due to not feeling well. She is following a Regular diet. She is not exercising. She does not smoke.  Use of agents associated with hypertension: none.   Outside blood pressures are not being checked. Symptoms: No chest pain No chest pressure  No palpitations No syncope  No dyspnea No orthopnea  No paroxysmal nocturnal dyspnea No lower extremity edema   Pertinent labs: Lab Results  Component Value Date   CHOL 184 07/05/2020   HDL 37 (L) 07/05/2020   LDLCALC 134 (H) 07/05/2020   TRIG 68 07/05/2020   CHOLHDL 5.0 (H) 07/05/2020   Lab Results  Component Value Date   NA 141 10/03/2020   K 4.3 10/03/2020   CREATININE 0.86 10/03/2020   GFRNONAA 89 07/05/2020   GFRAA 103 07/05/2020   GLUCOSE 85 10/03/2020     The ASCVD Risk score (Goff DC Jr., et al., 2013) failed to calculate for the following reasons:   The 2013 ASCVD risk score is only valid for ages 78 to 31   --------------------------------------------------------------------------------------------------- Past Medical History:  Diagnosis Date   Allergy    Asthma    Depression    Hypertension    Past Surgical History:  Procedure Laterality Date   CESAREAN  SECTION     Family History  Problem Relation Age of Onset   COPD Mother    Hypertension Mother    Alcohol abuse Mother    Cancer Father    Hypertension Father    Alcohol abuse Paternal Uncle    Alcohol abuse Maternal Grandmother    COPD Maternal Grandmother    Alcohol abuse Maternal Grandfather    Hypertension Paternal Grandmother    Stroke Paternal Grandmother    Cancer Paternal Grandfather    Social History   Tobacco Use   Smoking status: Never   Smokeless tobacco: Never  Vaping Use   Vaping Use: Never used  Substance Use Topics   Alcohol use: Not Currently   Drug use: Never   Allergies  Allergen Reactions   Sulfa Antibiotics    Nickel Rash    Medications: Outpatient Medications Prior to Visit  Medication Sig   albuterol (VENTOLIN HFA) 108 (90 Base) MCG/ACT inhaler Inhale 1-2 puffs into the lungs every 6 (six) hours as needed for wheezing or shortness of breath.   Biotin 5 MG TBDP    buPROPion (WELLBUTRIN SR) 150 MG 12 hr tablet Take 1 tablet (150 mg total) by mouth 2 (two) times daily.   cetirizine (ZYRTEC) 10 MG tablet Take 1 tablet (10 mg total) by mouth daily.   lisinopril (ZESTRIL) 20 MG tablet Take 1 tablet (20  mg total) by mouth daily.   Multiple Vitamin (MULTIVITAMIN) tablet Take 1 tablet by mouth daily.   No facility-administered medications prior to visit.    Review of Systems      Objective    BP (!) 143/103 (BP Location: Right Arm, Patient Position: Sitting, Cuff Size: Large)   Pulse 76   Temp 98.8 F (37.1 C) (Oral)   Wt (!) 344 lb (156 kg)   LMP 02/23/2021   SpO2 98%   BMI 60.94 kg/m    Vitals:   03/06/21 1111 03/06/21 1112  BP: (!) 143/103 125/85  Pulse: 76   Temp: 98.8 F (37.1 C)   TempSrc: Oral   SpO2: 98%   Weight: (!) 344 lb (156 kg)    Physical Exam    No results found for any visits on 03/06/21.  Assessment & Plan     1. Essential hypertension  - CBC with Differential/Platelet - Comprehensive metabolic panel -  TSH - Lipid panel  2. Vitamin D insufficiency  - CBC with Differential/Platelet - VITAMIN D 25 Hydroxy (Vit-D Deficiency, Fractures)  3. Morbid obesity ([place])  - CBC with Differential/Platelet - Comprehensive metabolic panel - Hemoglobin A1c - TSH - Lipid panel  4. Mixed hyperlipidemia  - Comprehensive metabolic panel - TSH - Lipid panel  5. Folliculitis of right axilla  - CBC with Differential/Platelet  6. Depression, major, single episode, moderate (HCC)    No follow-ups on file.      I, Dennis Chrismon, PA-C, have reviewed all documentation for this visit. The documentation on 03/06/21 for the exam, diagnosis, procedures, and orders are all accurate and complete.    Vernie Murders, PA-C  [place] [phone] (phone) [phone] (fax)  [facility]  Addendum: I reviewed unsigned encounter notes after the retirement of [employer]. I am not aware of any additional clinical information regarding this visit.   Birdie Sons, MD

## 2021-03-06 ENCOUNTER — Encounter: Payer: Self-pay | Admitting: Family Medicine

## 2021-03-06 ENCOUNTER — Other Ambulatory Visit: Payer: Self-pay

## 2021-03-06 ENCOUNTER — Ambulatory Visit (INDEPENDENT_AMBULATORY_CARE_PROVIDER_SITE_OTHER): Payer: Medicaid Other | Admitting: Family Medicine

## 2021-03-06 VITALS — BP 125/85 | HR 76 | Temp 98.8°F | Wt 344.0 lb

## 2021-03-06 DIAGNOSIS — F321 Major depressive disorder, single episode, moderate: Secondary | ICD-10-CM

## 2021-03-06 DIAGNOSIS — I10 Essential (primary) hypertension: Secondary | ICD-10-CM | POA: Diagnosis not present

## 2021-03-06 DIAGNOSIS — E782 Mixed hyperlipidemia: Secondary | ICD-10-CM

## 2021-03-06 DIAGNOSIS — E559 Vitamin D deficiency, unspecified: Secondary | ICD-10-CM

## 2021-03-06 DIAGNOSIS — L739 Follicular disorder, unspecified: Secondary | ICD-10-CM

## 2021-03-06 MED ORDER — DOXYCYCLINE HYCLATE 100 MG PO TABS
100.0000 mg | ORAL_TABLET | Freq: Two times a day (BID) | ORAL | 0 refills | Status: DC
Start: 2021-03-06 — End: 2021-06-06

## 2021-03-07 ENCOUNTER — Encounter: Payer: Medicaid Other | Admitting: Obstetrics and Gynecology

## 2021-03-08 ENCOUNTER — Ambulatory Visit: Payer: Medicaid Other | Admitting: Nurse Practitioner

## 2021-03-27 IMAGING — CR DG LUMBAR SPINE COMPLETE 4+V
1 series · 6 of 6 positions shown · non-contrast
Comparison: CT 03/03/2017

CLINICAL DATA: Lumbar back pain

EXAM:
LUMBAR SPINE - COMPLETE 4+ VIEW

[Series 1: dg lumbar spine complete 4 +v · 0.14mm/px · 6 of 6 slices shown]
[im 1/6]
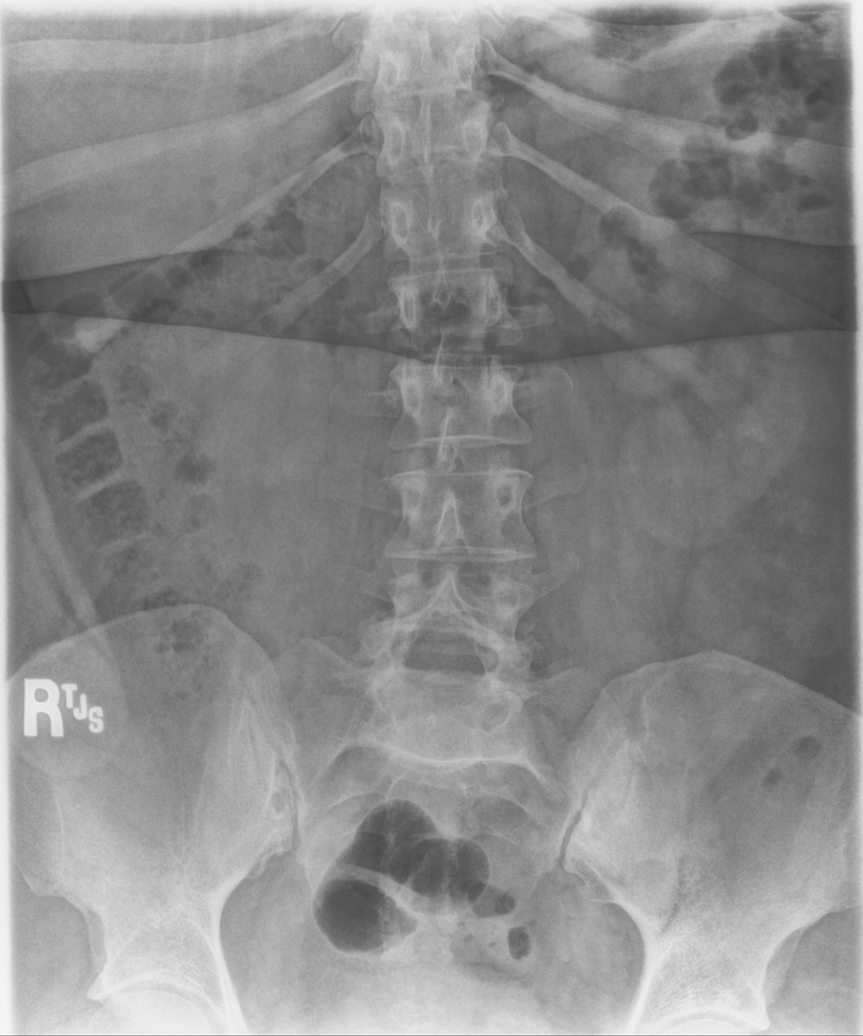
[im 2/6]
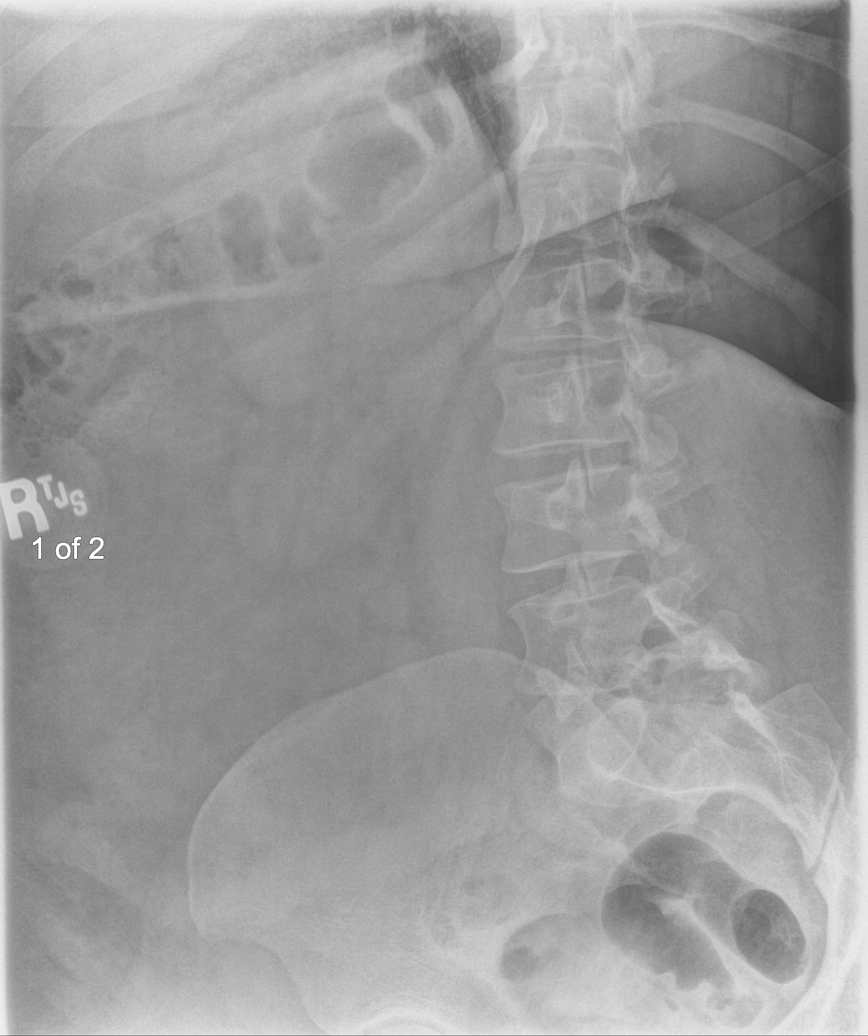
[im 3/6]
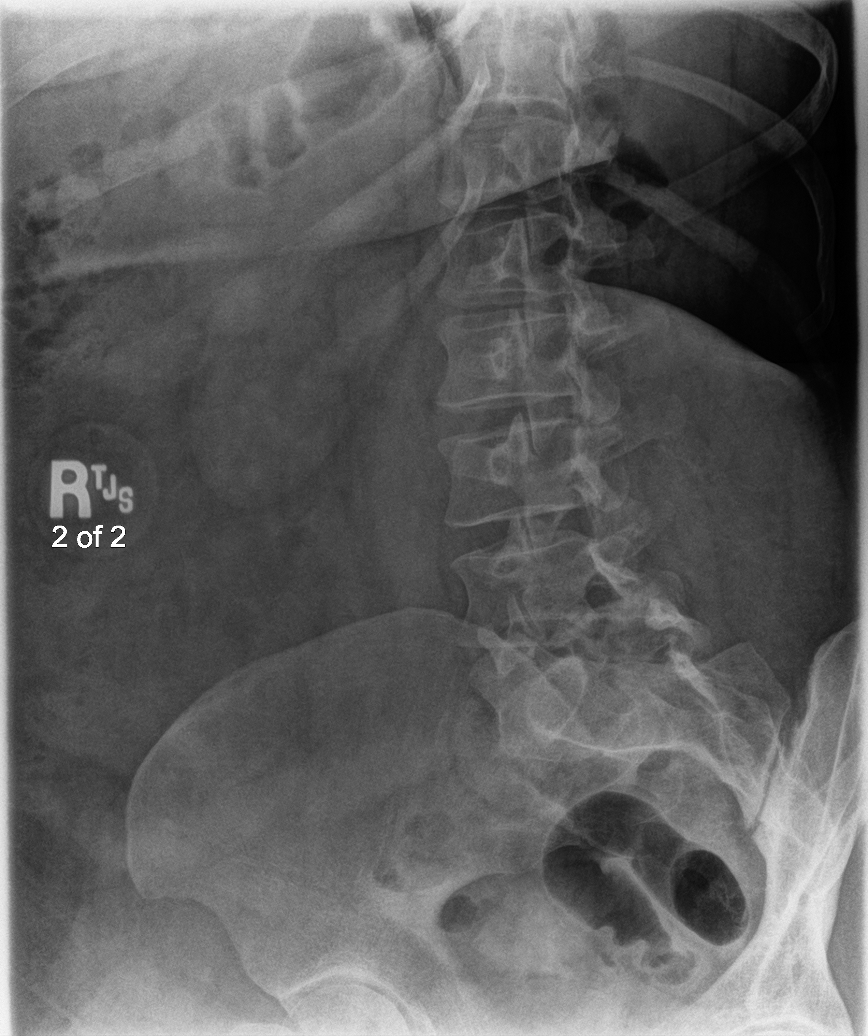
[im 4/6]
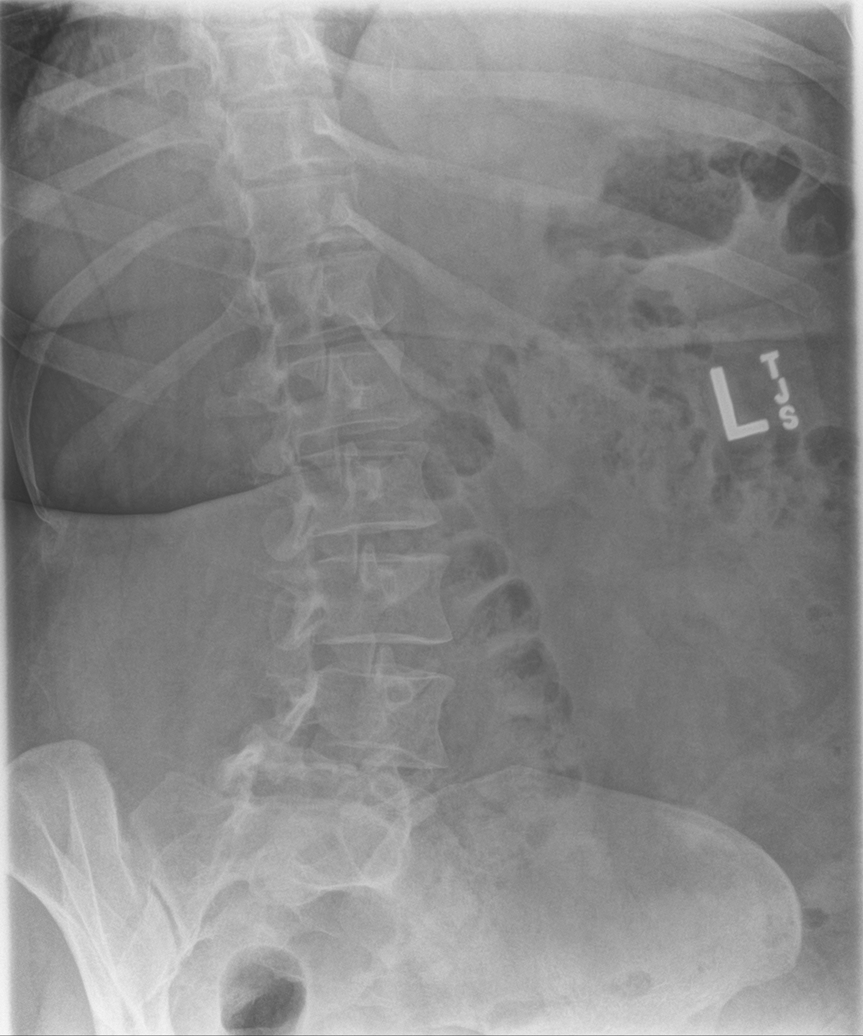
[im 5/6]
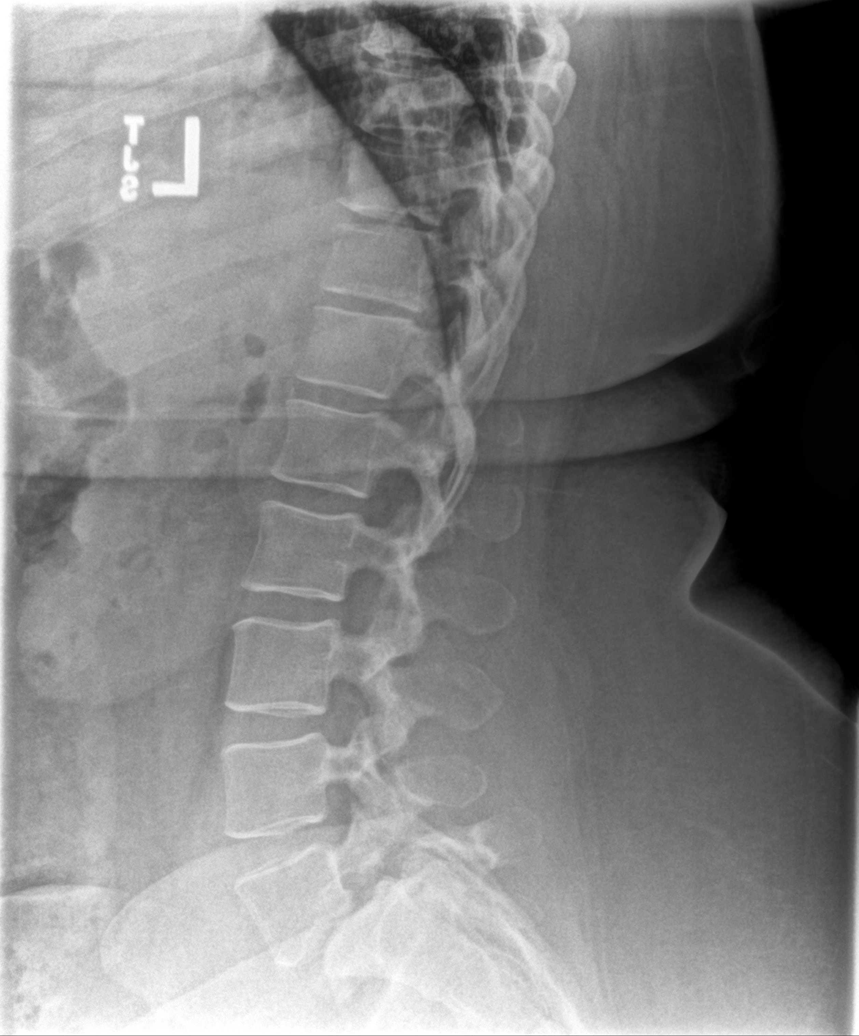
[im 6/6]
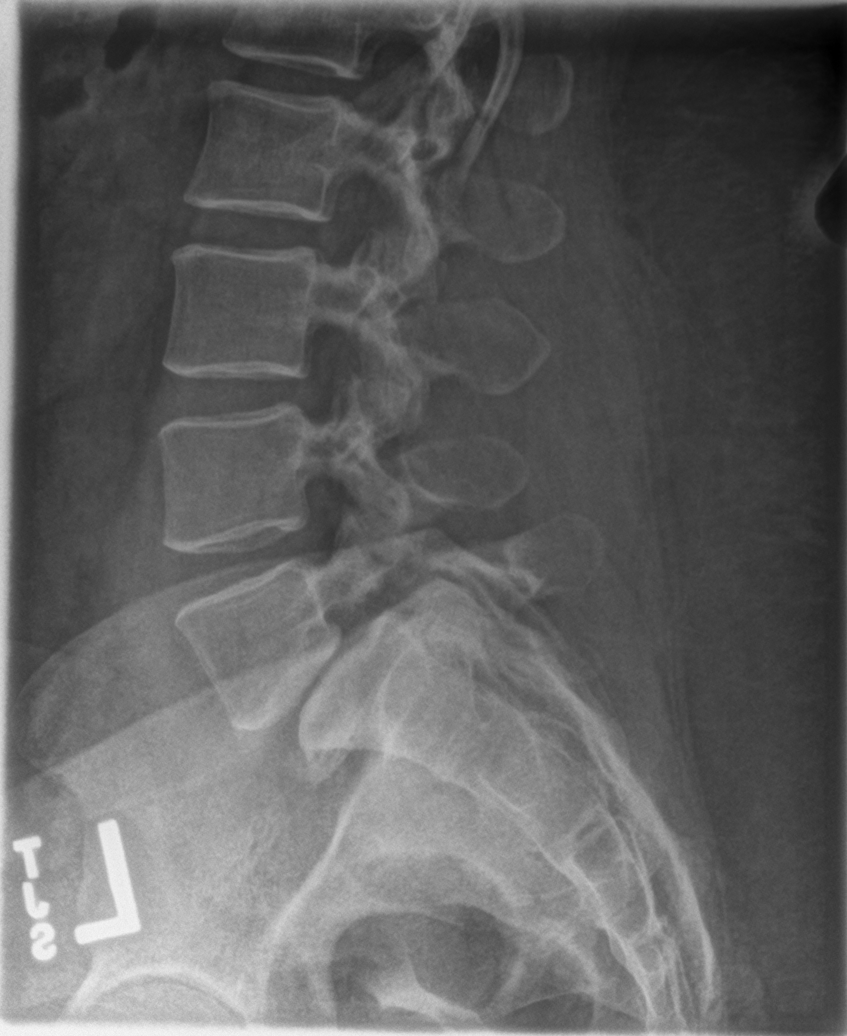

[6 of 6 positions shown; findings below may reference images not displayed]

FINDINGS: 15 mm anterolisthesis L5 on S1 with moderate degenerative changes.
Chronic bilateral pars defect at L5. Vertebral body heights are
maintained. Remaining disc spaces appear normal.
IMPRESSION: Chronic bilateral pars defect at L5 with 15 mm anterolisthesis L5 on
S1 and moderate degenerative changes. Listhesis may be increased
compared with prior CT from [DATE].

## 2021-03-28 ENCOUNTER — Ambulatory Visit: Payer: Medicaid Other | Admitting: Podiatry

## 2021-03-30 ENCOUNTER — Other Ambulatory Visit: Payer: Self-pay | Admitting: Family Medicine

## 2021-03-30 NOTE — Telephone Encounter (Signed)
Patient is out of this asking for short supply until refill comes. Medication Refill - Medication:nebulizer solution  Has the patient contacted their pharmacy? yes (Agent: If no, request that the patient contact the pharmacy for the refill.) (Agent: If yes, when and what did the pharmacy advise?)contact pcp  Preferred Pharmacy (with phone number or street name): [facility] [place]  Phone:  [phone] Fax:  [phone]   Agent: Please be advised that RX refills may take up to 3 business days. We ask that you follow-up with your pharmacy.

## 2021-03-31 NOTE — Telephone Encounter (Signed)
Requested medications are due for refill today NO  Requested medications are on the active medication list NO  Last visit 03/06/21  Future visit scheduled no, pt canceled 8/31  Notes to clinic Not on current med list.

## 2021-04-02 ENCOUNTER — Encounter: Payer: Self-pay | Admitting: Emergency Medicine

## 2021-04-02 ENCOUNTER — Ambulatory Visit
Admission: EM | Admit: 2021-04-02 | Discharge: 2021-04-02 | Disposition: A | Discharge status: Home / Self Care | Payer: Medicaid Other | Attending: Emergency Medicine | Admitting: Emergency Medicine

## 2021-04-02 ENCOUNTER — Other Ambulatory Visit: Payer: Self-pay

## 2021-04-02 DIAGNOSIS — J4521 Mild intermittent asthma with (acute) exacerbation: Secondary | ICD-10-CM

## 2021-04-02 MED ORDER — ALBUTEROL SULFATE HFA 108 (90 BASE) MCG/ACT IN AERS: 1.0000 | INHALATION_SPRAY | Freq: Four times a day (QID) | RESPIRATORY_TRACT | 2 refills | Status: DC | PRN

## 2021-04-02 MED ORDER — AEROCHAMBER MV MISC: 2 refills | Status: DC

## 2021-04-02 MED ORDER — PREDNISONE 50 MG PO TABS: ORAL_TABLET | ORAL | 0 refills | Status: DC

## 2021-04-02 MED ORDER — ALBUTEROL SULFATE (2.5 MG/3ML) 0.083% IN NEBU: 2.5000 mg | INHALATION_SOLUTION | Freq: Four times a day (QID) | RESPIRATORY_TRACT | 12 refills | Status: DC | PRN

## 2021-04-02 NOTE — Discharge Instructions (Addendum)
Use the Albuterol inhaler with the spacer, 12- puffs every 4-6 hours as needed for shortnes of breath and wheezing.  You can also use the albuterol nebulizer solution in the place of the inhaler and spacer.  Take the Prednisone daily for 5 days.  Return for worsening symptoms.

## 2021-04-02 NOTE — ED Triage Notes (Signed)
Pt presents today with c/o of cough and shortness of breath x 2 weeks. She reports using her Albuterol inhaler this morning. Denies fever. Home Covid test yesterday, neg.

## 2021-04-02 NOTE — ED Provider Notes (Signed)
[facility]    CSN: [id number] Arrival date & time: 04/02/21  1208      History   Chief Complaint Chief Complaint  Patient presents with   Cough   Shortness of Breath    HPI Denise Contreras is a 30 y.o. female.   HPI  30 year old female here for evaluation of respiratory complaints.  Patient reports that she has been experiencing a nonproductive cough, wheezing, and shortness of breath for the past 2 weeks.  She states she is not short of breath and that she is walking or active.  She has a history of asthma but it is not well controlled.  She has used commendation of her inhaler and also her mother's inhaler.  She is not using her inhaler with a spacer.  She has not had a fever, runny nose, or nasal congestion.  Past Medical History:  Diagnosis Date   Allergy    Asthma    Depression    Hypertension     Patient Active Problem List   Diagnosis Date Noted   Irregular menses 01/03/2021   Moderate persistent asthma without complication 99991111   Vitamin D insufficiency 07/05/2020   Encounter for gynecological examination 07/05/2020   Depression, major, single episode, moderate ([place]) 07/05/2020   Lumbar pain 07/05/2020   Allergic rhinitis 11/24/2019   Mild intermittent asthma 11/24/2019   Essential hypertension 10/28/2019   BMI 60.0-69.9, adult ([place]) 10/28/2019   Asthma 08/19/2018   Fracture of manubrium, initial encounter for closed fracture 06/20/2018    Past Surgical History:  Procedure Laterality Date   CESAREAN SECTION      OB History     Gravida  2   Para  2   Term  2   Preterm      AB      Living  2      SAB      IAB      Ectopic      Multiple      Live Births               Home Medications    Prior to Admission medications   Medication Sig Start Date End Date Taking? Authorizing Provider  albuterol (PROVENTIL) (2.5 MG/3ML) 0.083% nebulizer solution Take 3 mLs (2.5 mg total) by nebulization every 6 (six)  hours as needed for wheezing or shortness of breath. 04/02/21  Yes Margarette Canada, NP  predniSONE (DELTASONE) 50 MG tablet Take 1 tablet daily by mouth for 5 days. 04/02/21  Yes Margarette Canada, NP  Spacer/Aero-Holding Josiah Lobo (AEROCHAMBER MV) inhaler Use as instructed 04/02/21  Yes Margarette Canada, NP  albuterol (VENTOLIN HFA) 108 (90 Base) MCG/ACT inhaler Inhale 1-2 puffs into the lungs every 6 (six) hours as needed for wheezing or shortness of breath. 04/02/21   Margarette Canada, NP  Biotin 5 MG TBDP  09/14/20   [provider]  buPROPion (WELLBUTRIN SR) 150 MG 12 hr tablet Take 1 tablet (150 mg total) by mouth 2 (two) times daily. 02/02/21   Chrismon, Vickki Muff, PA-C  cetirizine (ZYRTEC) 10 MG tablet Take 1 tablet (10 mg total) by mouth daily. 10/03/20   Flinchum, Kelby Aline, FNP  doxycycline (VIBRA-TABS) 100 MG tablet Take 1 tablet (100 mg total) by mouth 2 (two) times daily. 03/06/21   Chrismon, Vickki Muff, PA-C  lisinopril (ZESTRIL) 20 MG tablet Take 1 tablet (20 mg total) by mouth daily. 10/03/20   Flinchum, Kelby Aline, FNP  Multiple Vitamin (MULTIVITAMIN) tablet Take  1 tablet by mouth daily.    [provider]    Family History Family History  Problem Relation Age of Onset   COPD Mother    Hypertension Mother    Alcohol abuse Mother    Cancer Father    Hypertension Father    Alcohol abuse Paternal Uncle    Alcohol abuse Maternal Grandmother    COPD Maternal Grandmother    Alcohol abuse Maternal Grandfather    Hypertension Paternal Grandmother    Stroke Paternal Grandmother    Cancer Paternal Grandfather     Social History Social History   Tobacco Use   Smoking status: Never   Smokeless tobacco: Never  Vaping Use   Vaping Use: Never used  Substance Use Topics   Alcohol use: Not Currently   Drug use: Never     Allergies   Sulfa antibiotics and Nickel   Review of Systems Review of Systems  Constitutional:  Negative for activity change, appetite change and fever.  HENT:   Negative for congestion and rhinorrhea.   Respiratory:  Positive for cough, shortness of breath and wheezing.   Cardiovascular:  Negative for chest pain.  Hematological: Negative.   Psychiatric/Behavioral: Negative.      Physical Exam Triage Vital Signs ED Triage Vitals  Enc Vitals Group     BP 04/02/21 1244 136/80     Pulse Rate 04/02/21 1244 83     Resp 04/02/21 1244 20     Temp 04/02/21 1244 98.5 F (36.9 C)     Temp Source 04/02/21 1244 Oral     SpO2 04/02/21 1244 96 %     Weight --      Height --      Head Circumference --      Peak Flow --      Pain Score 04/02/21 1242 0     Pain Loc --      Pain Edu? --      Excl. in [place]? --    No data found.  Updated Vital Signs BP 136/80 (BP Location: Left Arm)   Pulse 83   Temp 98.5 F (36.9 C) (Oral)   Resp 20   LMP 03/12/2021   SpO2 96%   Visual Acuity Right Eye Distance:   Left Eye Distance:   Bilateral Distance:    Right Eye Near:   Left Eye Near:    Bilateral Near:     Physical Exam Vitals and nursing note reviewed.  Constitutional:      General: She is not in acute distress.    Appearance: She is well-developed. She is obese. She is not ill-appearing.  HENT:     Head: Normocephalic and atraumatic.  Cardiovascular:     Rate and Rhythm: Normal rate and regular rhythm.  Pulmonary:     Effort: No tachypnea or respiratory distress.     Breath sounds: Examination of the right-upper field reveals decreased breath sounds and wheezing. Examination of the left-upper field reveals decreased breath sounds and wheezing. Examination of the right-middle field reveals decreased breath sounds and wheezing. Examination of the left-middle field reveals decreased breath sounds and wheezing. Examination of the right-lower field reveals decreased breath sounds and wheezing. Examination of the left-lower field reveals decreased breath sounds and wheezing. Decreased breath sounds and wheezing present. No rhonchi or rales.   Musculoskeletal:     Cervical back: Normal range of motion and neck supple.  Lymphadenopathy:     Cervical: No cervical adenopathy.  Skin:  General: Skin is warm and dry.     Capillary Refill: Capillary refill takes less than 2 seconds.     Findings: No erythema or rash.  Neurological:     General: No focal deficit present.     Mental Status: She is alert and oriented to person, place, and time.  Psychiatric:        Mood and Affect: Mood normal.        Behavior: Behavior normal.     UC Treatments / Results  Labs (all labs ordered are listed, but only abnormal results are displayed) Labs Reviewed - No data to display  EKG   Radiology No results found.  Procedures Procedures (including critical care time)  Medications Ordered in UC Medications - No data to display  Initial Impression / Assessment and Plan / UC Course  I have reviewed the triage vital signs and the nursing notes.  Pertinent labs & imaging results that were available during my care of the patient were reviewed by me and considered in my medical decision making (see chart for details).  Is a nontoxic-appearing 30 year old female here for evaluation of cough and shortness of breath for the past 2 weeks that has not been responding to her inhalers, which she has been using without a spacer, patient has a history of asthma.  She has not had a fever.  She has not had any upper respiratory symptoms.  Patient states that she thinks she has got bronchitis.  Patient's physical exam reveals a patient who is not in any acute distress and can speak in full sentences.  No tachypnea or dyspnea on exam.  Cardiopulmonary exam reveals S1-S2 heart sounds without murmur, gallop, or rub.  Lung sounds reveal inspirational and expiration all wheezes with a prolonged expiratory phase.  No rhonchi or rales appreciated exam.  Patient's exam is consistent with an asthma exacerbation.  I will prescribe patient albuterol inhaler refill as  well as a spacer.  I discussed with her the benefits of using as Hailer with a spacer versus just using inhaler alone.  Patient is also requesting albuterol ampules for help with nebulizer solution.  We will also prescribe prednisone 50 mg burst dosing for the next 5 days.   Final Clinical Impressions(s) / UC Diagnoses   Final diagnoses:  Mild intermittent asthma with exacerbation     Discharge Instructions      Use the Albuterol inhaler with the spacer, 12- puffs every 4-6 hours as needed for shortnes of breath and wheezing.  You can also use the albuterol nebulizer solution in the place of the inhaler and spacer.  Take the Prednisone daily for 5 days.  Return for worsening symptoms.      ED Prescriptions     Medication Sig Dispense Auth. Provider   albuterol (VENTOLIN HFA) 108 (90 Base) MCG/ACT inhaler Inhale 1-2 puffs into the lungs every 6 (six) hours as needed for wheezing or shortness of breath. 18 g Margarette Canada, NP   Spacer/Aero-Holding Chambers (AEROCHAMBER MV) inhaler Use as instructed 1 each Margarette Canada, NP   albuterol (PROVENTIL) (2.5 MG/3ML) 0.083% nebulizer solution Take 3 mLs (2.5 mg total) by nebulization every 6 (six) hours as needed for wheezing or shortness of breath. 75 mL Margarette Canada, NP   predniSONE (DELTASONE) 50 MG tablet Take 1 tablet daily by mouth for 5 days. 5 tablet Margarette Canada, NP      PDMP not reviewed this encounter.   Margarette Canada, NP 04/02/21 1321

## 2021-04-03 MED ORDER — IPRATROPIUM-ALBUTEROL 0.5-2.5 (3) MG/3ML IN SOLN: 3.0000 mL | Freq: Four times a day (QID) | RESPIRATORY_TRACT | 1 refills | Status: DC | PRN

## 2021-04-04 ENCOUNTER — Ambulatory Visit: Payer: Medicaid Other | Admitting: Podiatry

## 2021-04-04 ENCOUNTER — Other Ambulatory Visit: Payer: Self-pay

## 2021-04-04 DIAGNOSIS — M722 Plantar fascial fibromatosis: Secondary | ICD-10-CM

## 2021-04-07 ENCOUNTER — Encounter: Payer: Self-pay | Admitting: Podiatry

## 2021-04-07 NOTE — Progress Notes (Signed)
  Subjective:  Patient ID: Denise Contreras, female    DOB: 1990/10/29,  MRN: [id number]  Chief Complaint  Patient presents with   Foot Pain    Bilateral heel pain - requesting injections    30 y.o. female presents with the above complaint. History confirmed with patient.  Still doing okay but has some tenderness of the heel  Objective:  Physical Exam: warm, good capillary refill, no trophic changes or ulcerative lesions, normal DP and PT pulses and normal sensory exam.  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 Plantar fasciitis       Plan:  Patient was evaluated and treated and all questions answered.  Discussed the etiology and treatment options for plantar fasciitis including stretching, formal physical therapy, supportive shoegears such as a running shoe or sneaker, pre fabricated orthoses, injection therapy, and oral medications. We also discussed the role of surgical treatment of this for patients who do not improve after exhausting non-surgical treatment options.   Repeat injections performed bilaterally.  We also discussed eventual custom orthoses for long-term support and she will consider this  After sterile prep with povidone-iodine solution and alcohol, the bilateral heel was injected with 0.5cc 2% xylocaine plain, 0.5cc 0.5% marcaine plain, '5mg'$  triamcinolone acetonide, and '2mg'$  dexamethasone was injected along the medial plantar fascia at the insertion on the plantar calcaneus. The patient tolerated the procedure well without complication.  Return if symptoms worsen or fail to improve.

## 2021-05-08 ENCOUNTER — Ambulatory Visit
Admission: EM | Admit: 2021-05-08 | Discharge: 2021-05-08 | Disposition: A | Discharge status: Home / Self Care | Payer: Medicaid Other | Attending: Student | Admitting: Student

## 2021-05-08 ENCOUNTER — Ambulatory Visit: Payer: Self-pay

## 2021-05-08 ENCOUNTER — Encounter: Payer: Self-pay | Admitting: Licensed Clinical Social Worker

## 2021-05-08 ENCOUNTER — Other Ambulatory Visit: Payer: Self-pay

## 2021-05-08 DIAGNOSIS — J45901 Unspecified asthma with (acute) exacerbation: Secondary | ICD-10-CM

## 2021-05-08 MED ORDER — METHYLPREDNISOLONE 4 MG PO TBPK: ORAL_TABLET | ORAL | 0 refills | Status: DC

## 2021-05-08 NOTE — Discharge Instructions (Addendum)
-  Take medications as prescribed. -Continue to use inhaler in addition to nebulizer treatments -Follow-up with primary care if symptoms fail to improve.

## 2021-05-08 NOTE — Telephone Encounter (Signed)
Pt. Reports both she has asthma and is having more "issues with it." Seen in ED 04/02/21. Reports both of her children have recently had RSV. She has a cough now , shortness of breath. Using her nebulizer twice a day. No availability in the practice. Pt. Will to UC. Asking for referral to pulmonary for increase in asthma problems. Please advise.    Reason for Disposition  [1] Longstanding difficulty breathing (e.g., CHF, COPD, emphysema) AND [2] WORSE than normal  Answer Assessment - Initial Assessment Questions 1. RESPIRATORY STATUS: "Describe your breathing?" (e.g., wheezing, shortness of breath, unable to speak, severe coughing)      Shortness of breath 2. ONSET: "When did this breathing problem begin?"      2-3 days 3. PATTERN "Does the difficult breathing come and go, or has it been constant since it started?"      Constant 4. SEVERITY: "How bad is your breathing?" (e.g., mild, moderate, severe)    - MILD: No SOB at rest, mild SOB with walking, speaks normally in sentences, can lie down, no retractions, pulse < 100.    - MODERATE: SOB at rest, SOB with minimal exertion and prefers to sit, cannot lie down flat, speaks in phrases, mild retractions, audible wheezing, pulse 100-120.    - SEVERE: Very SOB at rest, speaks in single words, struggling to breathe, sitting hunched forward, retractions, pulse > 120      Mild - moderate 5. RECURRENT SYMPTOM: "Have you had difficulty breathing before?" If Yes, ask: "When was the last time?" and "What happened that time?"      Yes 6. CARDIAC HISTORY: "Do you have any history of heart disease?" (e.g., heart attack, angina, bypass surgery, angioplasty)      No 7. LUNG HISTORY: "Do you have any history of lung disease?"  (e.g., pulmonary embolus, asthma, emphysema)     Asthma 8. CAUSE: "What do you think is causing the breathing problem?"      Unsure 9. OTHER SYMPTOMS: "Do you have any other symptoms? (e.g., dizziness, runny nose, cough, chest pain,  fever)     Cough - dry cough 10. O2 SATURATION MONITOR:  "Do you use an oxygen saturation monitor (pulse oximeter) at home?" If Yes, "What is your reading (oxygen level) today?" "What is your usual oxygen saturation reading?" (e.g., 95%)       No 11. PREGNANCY: "Is there any chance you are pregnant?" "When was your last menstrual period?"       No 12. TRAVEL: "Have you traveled out of the country in the last month?" (e.g., travel history, exposures)       No  Protocols used: Breathing Difficulty-A-AH

## 2021-05-08 NOTE — ED Triage Notes (Signed)
Pt states having trouble breathing x 2 days, states she has asthma and has been using nebulizer and inhaler often.

## 2021-05-08 NOTE — ED Provider Notes (Signed)
[facility]    CSN: [id number] Arrival date & time: 05/08/21  1550      History   Chief Complaint Chief Complaint  Patient presents with   Shortness of Breath    asthma    HPI Denise Contreras is a 30 y.o. female for evaluation of increased shortness of breath ongoing for the last several days.  The patient is a single mother and has 37 young daughters.  Both of her daughters have recently been diagnosed with RSV.  The patient does have a history of asthma and does use a albuterol inhaler as needed and she also does have nebulizer treatments at home.  The patient has noticed increased dry cough with occasional productivity.  Denies any headaches or vision changes.  Denies any fevers or chills at home.  She denies any sick contacts outside of her children.  No recent travel.  Denies any chest pain or inability to catch her breath.   Shortness of Breath Associated symptoms: cough and wheezing   Associated symptoms: no fever and no vomiting    Past Medical History:  Diagnosis Date   Allergy    Asthma    Depression    Hypertension     Patient Active Problem List   Diagnosis Date Noted   Irregular menses 01/03/2021   Moderate persistent asthma without complication 75/64/3329   Vitamin D insufficiency 07/05/2020   Encounter for gynecological examination 07/05/2020   Depression, major, single episode, moderate ([place]) 07/05/2020   Lumbar pain 07/05/2020   Allergic rhinitis 11/24/2019   Mild intermittent asthma 11/24/2019   Essential hypertension 10/28/2019   BMI 60.0-69.9, adult ([place]) 10/28/2019   Asthma 08/19/2018   Fracture of manubrium, initial encounter for closed fracture 06/20/2018    Past Surgical History:  Procedure Laterality Date   CESAREAN SECTION      OB History     Gravida  2   Para  2   Term  2   Preterm      AB      Living  2      SAB      IAB      Ectopic      Multiple      Live Births               Home  Medications    Prior to Admission medications   Medication Sig Start Date End Date Taking? Authorizing Provider  Biotin 5 MG TBDP  09/14/20  Yes [provider]  buPROPion (WELLBUTRIN SR) 150 MG 12 hr tablet Take 1 tablet (150 mg total) by mouth 2 (two) times daily. 02/02/21  Yes Chrismon, Vickki Muff, PA-C  cetirizine (ZYRTEC) 10 MG tablet Take 1 tablet (10 mg total) by mouth daily. 10/03/20  Yes Flinchum, Kelby Aline, FNP  ipratropium-albuterol (DUONEB) 0.5-2.5 (3) MG/3ML SOLN Take 3 mLs by nebulization every 6 (six) hours as needed. 04/03/21  Yes Tally Joe T, FNP  lisinopril (ZESTRIL) 20 MG tablet Take 1 tablet (20 mg total) by mouth daily. 10/03/20  Yes Flinchum, Kelby Aline, FNP  methylPREDNISolone (MEDROL DOSEPAK) 4 MG TBPK tablet Take per package instructions. 05/08/21  Yes Lattie Corns, PA-C  Multiple Vitamin (MULTIVITAMIN) tablet Take 1 tablet by mouth daily.   Yes [provider]  Spacer/Aero-Holding Chambers (AEROCHAMBER MV) inhaler Use as instructed 04/02/21  Yes Margarette Canada, NP  doxycycline (VIBRA-TABS) 100 MG tablet Take 1 tablet (100 mg total) by mouth 2 (two) times daily. 03/06/21  Chrismon, Vickki Muff, PA-C    Family History Family History  Problem Relation Age of Onset   COPD Mother    Hypertension Mother    Alcohol abuse Mother    Cancer Father    Hypertension Father    Alcohol abuse Paternal Uncle    Alcohol abuse Maternal Grandmother    COPD Maternal Grandmother    Alcohol abuse Maternal Grandfather    Hypertension Paternal Grandmother    Stroke Paternal Grandmother    Cancer Paternal Grandfather     Social History Social History   Tobacco Use   Smoking status: Never   Smokeless tobacco: Never  Vaping Use   Vaping Use: Never used  Substance Use Topics   Alcohol use: Not Currently   Drug use: Never     Allergies   Sulfa antibiotics and Nickel   Review of Systems Review of Systems  Constitutional:  Negative for fever.  HENT: Negative.     Eyes: Negative.   Respiratory:  Positive for cough, shortness of breath and wheezing.   Gastrointestinal:  Negative for nausea and vomiting.  Endocrine: Negative.   Genitourinary: Negative.   Musculoskeletal: Negative.   Skin: Negative.   Allergic/Immunologic: Negative.   Neurological: Negative.   Hematological: Negative.   Psychiatric/Behavioral: Negative.      Physical Exam Triage Vital Signs ED Triage Vitals  Enc Vitals Group     BP 05/08/21 1750 (!) 149/103     Pulse Rate 05/08/21 1750 84     Resp 05/08/21 1750 16     Temp 05/08/21 1750 98.5 F (36.9 C)     Temp Source 05/08/21 1750 Oral     SpO2 05/08/21 1750 98 %     Weight 05/08/21 1746 (!) 320 lb (145.2 kg)     Height 05/08/21 1746 5\' 3"  (1.6 m)     Head Circumference --      Peak Flow --      Pain Score 05/08/21 1746 0     Pain Loc --      Pain Edu? --      Excl. in [place]? --    No data found.  Updated Vital Signs BP (!) 149/103 Comment: pt has not taken bp meds  Pulse 84   Temp 98.5 F (36.9 C) (Oral)   Resp 16   Ht 5\' 3"  (1.6 m)   Wt (!) 320 lb (145.2 kg)   LMP 04/11/2021 (Approximate)   SpO2 98%   BMI 56.69 kg/m   Visual Acuity Right Eye Distance:   Left Eye Distance:   Bilateral Distance:    Right Eye Near:   Left Eye Near:    Bilateral Near:     Physical Exam Constitutional:      General: She is not in acute distress.    Appearance: She is well-developed. She is not ill-appearing or toxic-appearing.  HENT:     Head: Normocephalic.     Mouth/Throat:     Mouth: Mucous membranes are moist.     Pharynx: Oropharynx is clear. No pharyngeal swelling or oropharyngeal exudate.  Eyes:     Pupils: Pupils are equal, round, and reactive to light.  Cardiovascular:     Rate and Rhythm: Normal rate and regular rhythm.  Pulmonary:     Effort: Pulmonary effort is normal.     Breath sounds: Examination of the right-upper field reveals wheezing. Examination of the left-upper field reveals wheezing.  Wheezing present.  Abdominal:     General: Bowel sounds are  normal.     Palpations: Abdomen is soft.  Musculoskeletal:     Cervical back: Normal range of motion.  Lymphadenopathy:     Cervical: No cervical adenopathy.  Neurological:     Mental Status: She is alert.   UC Treatments / Results  Labs (all labs ordered are listed, but only abnormal results are displayed) Labs Reviewed - No data to display  EKG   Radiology No results found.  Procedures Procedures (including critical care time)  Medications Ordered in UC Medications - No data to display  Initial Impression / Assessment and Plan / UC Course  I have reviewed the triage vital signs and the nursing notes.  Pertinent labs & imaging results that were available during my care of the patient were reviewed by me and considered in my medical decision making (see chart for details).     1.  Treatment options were discussed today with the patient. 2.  The patient does have mild to moderate wheeze to bilateral upper lung fields. 3.  Encouraged the patient to continue with her as needed inhalers and nebulizer treatments.  Prescribed a Medrol Dosepak for the patient to begin taking as well. 4.  Follow-up if no improvement of symptoms. Final Clinical Impressions(s) / UC Diagnoses   Final diagnoses:  Mild asthma with exacerbation, unspecified whether persistent     Discharge Instructions      -Take medications as prescribed. -Continue to use inhaler in addition to nebulizer treatments -Follow-up with primary care if symptoms fail to improve.   ED Prescriptions     Medication Sig Dispense Auth. Provider   methylPREDNISolone (MEDROL DOSEPAK) 4 MG TBPK tablet Take per package instructions. 21 tablet Lattie Corns, PA-C      PDMP not reviewed this encounter.   Nhyla, Nappi, PA-C 05/08/21 1843

## 2021-05-09 ENCOUNTER — Telehealth: Payer: Self-pay

## 2021-05-09 NOTE — Telephone Encounter (Signed)
Copied from [place] [id number]. Topic: Referral - Request for Referral >> May 08, 2021  2:34 PM Tessa Lerner A wrote: Has patient seen PCP for this complaint? Yes.    *If NO, is insurance requiring patient see PCP for this issue before PCP can refer them?  Referral for which specialty: Pulmonology   Preferred provider/office: Patient has no preference   Reason for referral: Asthma concerns

## 2021-05-10 ENCOUNTER — Other Ambulatory Visit: Payer: Self-pay | Admitting: Family Medicine

## 2021-05-10 DIAGNOSIS — J4540 Moderate persistent asthma, uncomplicated: Secondary | ICD-10-CM

## 2021-05-28 ENCOUNTER — Ambulatory Visit: Payer: Self-pay

## 2021-05-28 ENCOUNTER — Telehealth (INDEPENDENT_AMBULATORY_CARE_PROVIDER_SITE_OTHER): Payer: Medicaid Other | Admitting: Family Medicine

## 2021-05-28 ENCOUNTER — Other Ambulatory Visit: Payer: Self-pay

## 2021-05-28 ENCOUNTER — Encounter: Payer: Self-pay | Admitting: Family Medicine

## 2021-05-28 VITALS — Wt 340.0 lb

## 2021-05-28 DIAGNOSIS — M25531 Pain in right wrist: Secondary | ICD-10-CM

## 2021-05-28 MED ORDER — MELOXICAM 15 MG PO TABS
15.0000 mg | ORAL_TABLET | Freq: Every day | ORAL | 0 refills | Status: DC
Start: 2021-05-28 — End: 2021-07-03

## 2021-05-28 NOTE — Telephone Encounter (Signed)
Pt injured right wrist transferring hr daughter to her car seat. Pt felt pain since Saturday. No pain at rest : severe pain with movement. Denies swelling, redness. Pt is at work and cannot come in to a [place]. Scheduled pt for a VV at 1440. Johnell Comings at office and and verified appt is present.   Reason for Disposition  Weakness (i.e., loss of strength) of new-onset in hand or fingers  (Exceptions: not truly weak, hand feels weak because of pain; weakness present > 2 weeks)  Answer Assessment - Initial Assessment Questions 1. ONSET: "When did the pain start?"     Saturday 2. LOCATION: "Where is the pain located?"     Can [place] it but certain movements hurts to the right side wrist 3. PAIN: "How bad is the pain?" (Scale 1-10; or mild, moderate, severe)   - MILD (1-3): doesn't interfere with normal activities   - MODERATE (4-7): interferes with normal activities (e.g., work or school) or awakens from sleep   - SEVERE (8-10): excruciating pain, unable to use hand at all     Mild but with use moderate 4. WORK OR EXERCISE: "Has there been any recent work or exercise that involved this part (i.e., hand or wrist) of the body?"     Putting daughter in car 5. CAUSE: "What do you think is causing the pain?"     Right shoulder under left hand   right hand under butt  when tried to put  6. AGGRAVATING FACTORS: "What makes the pain worse?" (e.g., using computer)     Yes- with activity 9/10 with use 7. OTHER SYMPTOMS: "Do you have any other symptoms?" (e.g., neck pain, swelling, rash, numbness, fever)      8. PREGNANCY: "Is there any chance you are pregnant?" "When was your last menstrual period?"     No-LMP: 1 week ago  Protocols used: Hand and Wrist Pain-A-AH

## 2021-05-28 NOTE — Patient Instructions (Signed)
Write the ABCs with your wrist daily for exercising it.

## 2021-05-28 NOTE — Progress Notes (Signed)
MyChart Video Visit    Virtual Visit via Video Note   This visit type was conducted due to national recommendations for restrictions regarding the COVID-19 Pandemic (e.g. social distancing) in an effort to limit this patient's exposure and mitigate transmission in our community. This patient is at least at moderate risk for complications without adequate follow up. This format is felt to be most appropriate for this patient at this time. Physical exam was limited by quality of the video and audio technology used for the visit.    Patient location: home Provider location: [place] involved in the visit: patient, provider   I discussed the limitations of evaluation and management by telemedicine and the availability of in person appointments. The patient expressed understanding and agreed to proceed.  Patient: Denise Contreras   DOB: 11/27/1990   30 y.o. Female  MRN: [id number] Visit Date: 05/28/2021  Today's healthcare provider: Lavon Paganini, MD   No chief complaint on file.  Subjective    Wrist Pain  The pain is present in the right wrist. This is a recurrent problem. The current episode started more than 1 year ago (3-4 months). There has been no history of extremity trauma. The problem has been gradually worsening. The quality of the pain is described as aching. The pain is at a severity of 3/10. Associated symptoms include an inability to bear weight. Treatments tried: wrist brace. The treatment provided no relief.    X2 days ago, mild before that. Ramped up after holding her 30 year old. Hurts with lifting, bending. On the ulnar dorsal side of wrist. Unable to hold any weight >2lbs.  Wearing brace. Doesn't hurt at rest.  R hand dominant. Has not been evaluated previously.  Medications: Outpatient Medications Prior to Visit  Medication Sig   Biotin 5 MG TBDP    buPROPion (WELLBUTRIN SR) 150 MG 12 hr tablet Take 1 tablet (150 mg total) by mouth  2 (two) times daily.   cetirizine (ZYRTEC) 10 MG tablet Take 1 tablet (10 mg total) by mouth daily.   doxycycline (VIBRA-TABS) 100 MG tablet Take 1 tablet (100 mg total) by mouth 2 (two) times daily.   ipratropium-albuterol (DUONEB) 0.5-2.5 (3) MG/3ML SOLN Take 3 mLs by nebulization every 6 (six) hours as needed.   lisinopril (ZESTRIL) 20 MG tablet Take 1 tablet (20 mg total) by mouth daily.   methylPREDNISolone (MEDROL DOSEPAK) 4 MG TBPK tablet Take per package instructions.   Multiple Vitamin (MULTIVITAMIN) tablet Take 1 tablet by mouth daily.   Spacer/Aero-Holding Chambers (AEROCHAMBER MV) inhaler Use as instructed   No facility-administered medications prior to visit.    Review of Systems  Constitutional: Negative.   Musculoskeletal:  Positive for arthralgias. Negative for gait problem, joint swelling and myalgias.  Neurological: Negative.      Objective    There were no vitals taken for this visit.   Physical Exam Constitutional:      General: She is not in acute distress.    Appearance: Normal appearance.  HENT:     Head: Normocephalic.  Pulmonary:     Effort: Pulmonary effort is normal. No respiratory distress.  Neurological:     Mental Status: She is alert and oriented to person, place, and time. Mental status is at baseline.       Assessment & Plan     1. Right wrist pain New problem Likely tendinitis  Doubt any bony injury given mechanism No XRay at this time HEP, ice,  rest, brace, NSAIDs Return precautions discussed Consider Ortho referral or Sports med if not improving  Meds ordered this encounter  Medications   meloxicam (MOBIC) 15 MG tablet    Sig: Take 1 tablet (15 mg total) by mouth daily.    Dispense:  30 tablet    Refill:  0     No follow-ups on file.     I discussed the assessment and treatment plan with the patient. The patient was provided an opportunity to ask questions and all were answered. The patient agreed with the plan and  demonstrated an understanding of the instructions.   The patient was advised to call back or seek an in-person evaluation if the symptoms worsen or if the condition fails to improve as anticipated.  I, Lavon Paganini, MD, have reviewed all documentation for this visit. The documentation on 05/28/21 for the exam, diagnosis, procedures, and orders are all accurate and complete.   Prateek Knipple, Dionne Bucy, MD, MPH [facility]

## 2021-06-06 ENCOUNTER — Encounter: Payer: Self-pay | Admitting: Internal Medicine

## 2021-06-06 ENCOUNTER — Ambulatory Visit (INDEPENDENT_AMBULATORY_CARE_PROVIDER_SITE_OTHER): Payer: Medicaid Other | Admitting: Internal Medicine

## 2021-06-06 ENCOUNTER — Other Ambulatory Visit: Payer: Self-pay

## 2021-06-06 ENCOUNTER — Other Ambulatory Visit
Admission: RE | Admit: 2021-06-06 | Discharge: 2021-06-06 | Disposition: A | Discharge status: Home / Self Care | Payer: Medicaid Other | Source: Ambulatory Visit | Attending: Internal Medicine | Admitting: Internal Medicine

## 2021-06-06 DIAGNOSIS — J4520 Mild intermittent asthma, uncomplicated: Secondary | ICD-10-CM

## 2021-06-06 DIAGNOSIS — I10 Essential (primary) hypertension: Secondary | ICD-10-CM

## 2021-06-06 DIAGNOSIS — Z6841 Body Mass Index (BMI) 40.0 and over, adult: Secondary | ICD-10-CM

## 2021-06-06 LAB — CBC WITH DIFFERENTIAL/PLATELET
Abs Immature Granulocytes: 0.01 10*3/uL (ref 0.00–0.07)
Basophils Absolute: 0.1 10*3/uL (ref 0.0–0.1)
Basophils Relative: 1 %
Eosinophils Absolute: 0.4 10*3/uL (ref 0.0–0.5)
Eosinophils Relative: 7 %
HCT: 41.2 % (ref 36.0–46.0)
Hemoglobin: 13.5 g/dL (ref 12.0–15.0)
Immature Granulocytes: 0 %
Lymphocytes Relative: 32 %
Lymphs Abs: 1.7 10*3/uL (ref 0.7–4.0)
MCH: 28.8 pg (ref 26.0–34.0)
MCHC: 32.8 g/dL (ref 30.0–36.0)
MCV: 88 fL (ref 80.0–100.0)
Monocytes Absolute: 0.4 10*3/uL (ref 0.1–1.0)
Monocytes Relative: 7 %
Neutro Abs: 2.8 10*3/uL (ref 1.7–7.7)
Neutrophils Relative %: 53 %
Platelets: 295 10*3/uL (ref 150–400)
RBC: 4.68 MIL/uL (ref 3.87–5.11)
RDW: 13.4 % (ref 11.5–15.5)
WBC: 5.2 10*3/uL (ref 4.0–10.5)
nRBC: 0 % (ref 0.0–0.2)

## 2021-06-06 MED ORDER — PREDNISONE 10 MG PO TABS: ORAL_TABLET | ORAL | 0 refills | Status: DC

## 2021-06-06 MED ORDER — OLMESARTAN MEDOXOMIL 20 MG PO TABS: 20.0000 mg | ORAL_TABLET | Freq: Every day | ORAL | 11 refills | Status: DC

## 2021-06-06 NOTE — Assessment & Plan Note (Signed)
D/c ACEi  06/06/2021 due to [place] on exam   In the best review of chronic cough to date ( NEJM 2016 375 [id number]) ,  ACEi are now felt to cause cough in up to  20% of pts which is a 4 fold increase from previous reports and does not include the variety of non-specific complaints we see in pulmonary clinic in pts on ACEi but previously attributed to another dx like  Copd/asthma and  include PNDS, throat and chest congestion, "bronchitis", unexplained dyspnea and noct "strangling" sensations, and hoarseness, but also  atypical /refractory GERD symptoms like dysphagia and "bad heartburn"   The only way I know  to prove this is not an "ACEi Case" is a trial off ACEi x a minimum of 6 weeks then regroup.   >>> try olmesartan 20 mg daily and ov in 6 weeks

## 2021-06-06 NOTE — Patient Instructions (Addendum)
Stop lisinopril and start olmesartan 20 mg one daily in its place  Plan A = Automatic = Always=    Breathe clear air  (I will be happy to call you in symbicort 80 if not better soon)   Work on inhaler technique:  relax and gently blow all the way out then take a nice smooth full deep breath back in, triggering the inhaler at same time you start breathing in.  Hold for up to 5 seconds if you can. Blow symbicort out thru nose. Rinse and gargle with water when done.  If mouth or throat bother you at all,  try brushing teeth/gums/tongue with arm and hammer toothpaste/ make a slurry and gargle and spit out.      Plan B = Backup (to supplement plan A, not to replace it) Only use your albuterol inhaler as a rescue medication to be used if you can't catch your breath by resting or doing a relaxed purse lip breathing pattern.  - The less you use it, the better it will work when you need it. - Ok to use the inhaler up to 2 puffs  every 4 hours if you must but call for appointment if use goes up over your usual need - Don't leave home without it !!  (think of it like the spare tire for your car)   Plan C = Crisis (instead of Plan B but only if Plan B stops working) - only use your albuterol nebulizer if you first try Plan B and it fails to help > ok to use the nebulizer up to every 4 hours but if start needing it regularly call for immediate appointment   Plan D = Deltasone  if ABC not working to your satisfaction  >>> Prednisone 10 mg take  4 each am x 2 days,   2 each am x 2 days,  1 each am x 2 days and stop   Weight control is  a matter of calorie balance which needs to be tilted in your favor by eating less and exercising more.  To get the most out of exercise, you need to be continuously aware that you are short of breath, but never out of breath, for 30 minutes daily. As you improve, it will actually be easier for you to do the same amount of exercise  in  30 minutes so always push to the level where  you are short of breath.     Think of your calorie balance like you do your bank account where in this case you want the balance to go down so you must take in less calories than you burn up.  It's just that simple:  Hard to do, but easy to understand.  Good luck!   Please remember to go to the lab department   for your tests - we will call you with the results when they are available.      Please schedule a follow up office visit in 6 weeks, call sooner if needed with all medications /inhalers/ solutions in hand so we can verify exactly what you are taking. This includes all medications from all doctors and over the counters

## 2021-06-06 NOTE — Progress Notes (Signed)
Denise Contreras, female    DOB: [birth year],    MRN: [id number]   Brief patient profile:  13  yowf never smoker with h/o asthma before elementary school, completely cleared then started back up p 1st child born 2017 never smoker with  referred to pulmonary clinic in [facility]  06/06/2021 by Daneil Dan Payne/[facility]   for asthma eval  due to freq flares.       History of Present Illness  06/06/2021  Pulmonary/ 1st office eval/ Rayetta Veith / [employer] on ACEi  Chief Complaint  Patient presents with   Consult    Hx of asthma. C/o sob and wheezing. Exposed to smoke.   Dyspnea:  mainly steps when doing well, doing 30 min of treadmill at speed 2 mph and incline 6  Cough: forces cough  not bringing  Sleep: bed is flat 2 pillows / once a week needs saba  SABA use: once a day avg hfa/ neb once a week.  Overall worse x one year  Better not and since last prednisone but back "wheezing again" /also overt HB taking otcs  No obvious day to day or daytime variability or assoc excess/ purulent sputum or mucus plugs or hemoptysis or cp or chest tightness,   or overt sinus or hb symptoms.     Also denies any obvious fluctuation of symptoms with weather or environmental changes or other aggravating or alleviating factors except as outlined above   No unusual exposure hx or h/o childhood pna/ asthma or knowledge of premature birth.  Current Allergies, Complete Past Medical History, Past Surgical History, Family History, and Social History were reviewed in [employer] record.  ROS  The following are not active complaints unless bolded Hoarseness, sore throat, dysphagia, dental problems, itching, sneezing,  nasal congestion or discharge of excess mucus or purulent secretions, ear ache,   fever, chills, sweats, unintended wt loss or wt gain, classically pleuritic or exertional cp,  orthopnea pnd or arm/hand swelling  or leg swelling, presyncope, palpitations, abdominal pain,  anorexia, nausea, vomiting, diarrhea  or change in bowel habits or change in bladder habits, change in stools or change in urine, dysuria, hematuria,  rash, arthralgias, visual complaints, headache, numbness, weakness or ataxia or problems with walking or coordination,  change in mood or  memory.              Past Medical History:  Diagnosis Date   Allergy    Asthma    Depression    Hypertension     Outpatient Medications Prior to Visit  Medication Sig Dispense Refill   albuterol (PROAIR HFA) 108 (90 Base) MCG/ACT inhaler Inhale into the lungs every 6 (six) hours as needed for wheezing or shortness of breath.     Biotin 5 MG TBDP      buPROPion (WELLBUTRIN SR) 150 MG 12 hr tablet Take 1 tablet (150 mg total) by mouth 2 (two) times daily. 60 tablet 3   cetirizine (ZYRTEC) 10 MG tablet Take 1 tablet (10 mg total) by mouth daily. 30 tablet 11   cholecalciferol (VITAMIN D3) 25 MCG (1000 UNIT) tablet Take 2,000 Units by mouth daily.     ELDERBERRY PO Take 1 tablet by mouth daily.     ipratropium-albuterol (DUONEB) 0.5-2.5 (3) MG/3ML SOLN Take 3 mLs by nebulization every 6 (six) hours as needed. 360 mL 1   lisinopril (ZESTRIL) 20 MG tablet Take 1 tablet (20 mg total) by mouth daily. 90 tablet 1  Multiple Vitamin (MULTIVITAMIN) tablet Take 1 tablet by mouth daily.     Spacer/Aero-Holding Chambers (AEROCHAMBER MV) inhaler Use as instructed 1 each 2       0   meloxicam (MOBIC) 15 MG tablet Take 1 tablet (15 mg total) by mouth daily. (Patient not taking: Reported on 06/06/2021) 30 tablet 0       0      Objective:     BP 138/80 (BP Location: Left Arm, Cuff Size: Large)   Pulse 73   Temp 97.8 F (36.6 C) (Temporal)   Ht 5\' 2"  (1.575 m)   Wt (!) 338 lb (153.3 kg)   SpO2 97%   BMI 61.82 kg/m   SpO2: 97 %  Amb obese bf classic pseudowheeze   HEENT : pt wearing mask not removed for exam due to covid -19 concerns.    NECK :  without JVD/Nodes/TM/ nl carotid upstrokes  bilaterally   LUNGS: no acc muscle use,  Nl contour chest which is clear to A and P bilaterally without cough on insp or exp maneuvers   CV:  RRR  no s3 or murmur or increase in P2, and no edema   ABD: obese  soft and nontender with nl inspiratory excursion in the supine position. No bruits or organomegaly appreciated, bowel sounds nl  MS:  Nl gait/ ext warm without deformities, calf tenderness, cyanosis or clubbing No obvious joint restrictions   SKIN: warm and dry without lesions    NEURO:  alert, approp, nl sensorium with  no motor or cerebellar deficits apparent.    Labs ordered 06/06/2021  :  allergy profile         Assessment   Mild intermittent asthma Asthma as child then recurred 2017 - Allergy profile 06/06/2021 >  Eos 0. /  IgE   - try off acei 06/06/2021   DDX of  difficult airways management almost all start with A and  include Adherence, Ace Inhibitors, Acid Reflux, Active Sinus Disease, Alpha 1 Antitripsin deficiency, Anxiety masquerading as Airways dz,  ABPA,  Allergy(esp in young), Aspiration (esp in elderly), Adverse effects of meds,  Active smoking or vaping, A bunch of PE's (a small clot burden can't cause this syndrome unless there is already severe underlying pulm or vascular dz with poor reserve) plus two Bs  = Bronchiectasis and Beta blocker use..and one C= CHF   Adherence is always the initial "prime suspect" and is a multilayered concern that requires a "trust but verify" approach in every patient - starting with knowing how to use medications, especially inhalers, correctly, keeping up with refills and understanding the fundamental difference between maintenance and prns vs those medications only taken for a very short course and then stopped and not refilled.  - - The proper method of use, as well as anticipated side effects, of a metered-dose inhaler were discussed and demonstrated to the patient using teach back method.   ACEi adverse effects at the  top  of the usual list of suspects and the only way to rule it out is a trial off > see a/p    ? Allergy > send profile/ zyrtec prn and if continues to "wheeze" off acei then start symbicort 80 2bid   ? GERD > note flared p IUP with assoc wt gain > try off acei first and just to diet as "hates taking pills"  >>> regroup in 6 weeks with all meds in hand using a trust but verify approach to confirm accurate  Medication  Reconciliation The principal here is that until we are certain that the  patients are doing what we've asked, it makes no sense to ask them to do more.      Essential hypertension D/c ACEi  06/06/2021 due to [place] on exam   In the best review of chronic cough to date ( NEJM 2016 375 7473-4037) ,  ACEi are now felt to cause cough in up to  20% of pts which is a 4 fold increase from previous reports and does not include the variety of non-specific complaints we see in pulmonary clinic in pts on ACEi but previously attributed to another dx like  Copd/asthma and  include PNDS, throat and chest congestion, "bronchitis", unexplained dyspnea and noct "strangling" sensations, and hoarseness, but also  atypical /refractory GERD symptoms like dysphagia and "bad heartburn"   The only way I know  to prove this is not an "ACEi Case" is a trial off ACEi x a minimum of 6 weeks then regroup.   >>> try olmesartan 20 mg daily and ov in 6 weeks    BMI 60.0-69.9, adult ([place]) Body mass index is 61.82 kg/m.       Lab Results  Component Value Date   TSH 3.740 10/03/2020    Contributing to doe and risk of GERD >>>   reviewed the need and the process to achieve and maintain neg calorie balance > defer f/u primary care including intermittently monitoring thyroid status        Each maintenance medication was reviewed in detail including emphasizing most importantly the difference between maintenance and prns and under what circumstances the prns are to be triggered using an action plan format  where appropriate.  Total time for H and P, chart review, counseling, reviewing hfa  device(s) and generating customized AVS unique to this office visit / same day charting  > 60 min         Christinia Gully, MD 06/06/2021

## 2021-06-06 NOTE — Assessment & Plan Note (Addendum)
Body mass index is 61.82 kg/m.       Lab Results  Component Value Date   TSH 3.740 10/03/2020      Contributing to doe and risk of GERD >>>   reviewed the need and the process to achieve and maintain neg calorie balance > defer f/u primary care including intermittently monitoring thyroid status          Each maintenance medication was reviewed in detail including emphasizing most importantly the difference between maintenance and prns and under what circumstances the prns are to be triggered using an action plan format where appropriate.  Total time for H and P, chart review, counseling, reviewing hfa  device(s) and generating customized AVS unique to this new pt  office visit / same day charting  > 60 min

## 2021-06-06 NOTE — Assessment & Plan Note (Addendum)
Asthma as child then recurred 2017 - Allergy profile 06/06/2021 >  Eos 0. /  IgE   - try off acei 06/06/2021   DDX of  difficult airways management almost all start with A and  include Adherence, Ace Inhibitors, Acid Reflux, Active Sinus Disease, Alpha 1 Antitripsin deficiency, Anxiety masquerading as Airways dz,  ABPA,  Allergy(esp in young), Aspiration (esp in elderly), Adverse effects of meds,  Active smoking or vaping, A bunch of PE's (a small clot burden can't cause this syndrome unless there is already severe underlying pulm or vascular dz with poor reserve) plus two Bs  = Bronchiectasis and Beta blocker use..and one C= CHF   Adherence is always the initial "prime suspect" and is a multilayered concern that requires a "trust but verify" approach in every patient - starting with knowing how to use medications, especially inhalers, correctly, keeping up with refills and understanding the fundamental difference between maintenance and prns vs those medications only taken for a very short course and then stopped and not refilled.  - - The proper method of use, as well as anticipated side effects, of a metered-dose inhaler were discussed and demonstrated to the patient using teach back method.   ACEi adverse effects at the  top of the usual list of suspects and the only way to rule it out is a trial off > see a/p    ? Allergy > send profile/ breathe clean air / zyrtec prn and if continues to "wheeze" off acei then start symbicort 80 2bid   ? GERD > note flared p IUP with assoc wt gain > try off acei first and just to diet as "hates taking pills"  >>> regroup in 6 weeks with all meds in hand using a trust but verify approach to confirm accurate Medication  Reconciliation The principal here is that until we are certain that the  patients are doing what we've asked, it makes no sense to ask them to do more.

## 2021-06-13 LAB — IGE: IgE (Immunoglobulin E), Serum: 2120 IU/mL — ABNORMAL HIGH (ref 6–495)

## 2021-06-15 ENCOUNTER — Telehealth: Payer: Self-pay | Admitting: Internal Medicine

## 2021-06-15 NOTE — Telephone Encounter (Signed)
Tanda Rockers, MD  06/14/2021  5:10 AM EST     Call patient :  Studies are c/w allergy which may require a formal allergy evaluation but not needed until see how she does with the recs already made.  Be sure patient has/keeps f/u ov so we can go over all the details of this study and get a plan together moving forward - ok to move up f/u if not feeling better and wants to be seen sooner    Patient is aware of results and voiced her understanding.  Pending OV 07/03/2021 Nothing further needed at this time.

## 2021-06-24 NOTE — Progress Notes (Deleted)
      Established patient visit   Patient: Denise Contreras   DOB: 08/12/1990   30 y.o. Female  MRN: [id number] Visit Date: 02/01/2021  Today's healthcare provider: Vernie Murders, PA-C   No chief complaint on file.  Subjective    HPI  ***  {Show patient history (optional):23778}   Medications: Outpatient Medications Prior to Visit  Medication Sig   Biotin 5 MG TBDP    cetirizine (ZYRTEC) 10 MG tablet Take 1 tablet (10 mg total) by mouth daily.   Multiple Vitamin (MULTIVITAMIN) tablet Take 1 tablet by mouth daily.   [DISCONTINUED] albuterol (VENTOLIN HFA) 108 (90 Base) MCG/ACT inhaler Inhale 1-2 puffs into the lungs every 6 (six) hours as needed for wheezing or shortness of breath.   [DISCONTINUED] buPROPion (WELLBUTRIN SR) 150 MG 12 hr tablet Take 1 tablet (150 mg total) by mouth 2 (two) times daily. Please keep appointment with new PCP   [DISCONTINUED] lisinopril (ZESTRIL) 20 MG tablet Take 1 tablet (20 mg total) by mouth daily.   [DISCONTINUED] meloxicam (MOBIC) 15 MG tablet Take 1 tablet (15 mg total) by mouth daily. (Patient not taking: No sig reported)   [DISCONTINUED] Norethindrone Acetate-Ethinyl Estradiol (JUNEL 1.5/30) 1.5-30 MG-MCG tablet Take 1 tablet by mouth daily.   [DISCONTINUED] Vitamin D, Ergocalciferol, (DRISDOL) 1.25 MG (50000 UNIT) CAPS capsule Take 1 capsule (50,000 Units total) by mouth every 7 (seven) days. (taking one tablet per week) walk in lab in office 1-2 weeks after completing prescription. Discontinue over the counter vitamin D while taking this. (Patient not taking: No sig reported)   No facility-administered medications prior to visit.    Review of Systems  {Labs  Heme  Chem  Endocrine  Serology  Results Review (optional):23779}   Objective    There were no vitals taken for this visit. {Show previous vital signs (optional):23777}   Physical Exam  ***  No results found for any visits on 02/01/21.  Assessment & Plan     ***  No  follow-ups on file.      {provider attestation***:1}   Vernie Murders, Hershal Coria  [facility] [phone] (phone) [phone] (fax)  [place]

## 2021-06-25 ENCOUNTER — Telehealth: Payer: Self-pay | Admitting: Internal Medicine

## 2021-06-25 ENCOUNTER — Encounter: Payer: Self-pay | Admitting: Family Medicine

## 2021-06-25 ENCOUNTER — Ambulatory Visit (INDEPENDENT_AMBULATORY_CARE_PROVIDER_SITE_OTHER): Payer: Medicaid Other | Admitting: Family Medicine

## 2021-06-25 ENCOUNTER — Other Ambulatory Visit: Payer: Self-pay

## 2021-06-25 VITALS — BP 141/96 | HR 77 | Temp 98.4°F | Wt 340.0 lb

## 2021-06-25 DIAGNOSIS — J4520 Mild intermittent asthma, uncomplicated: Secondary | ICD-10-CM | POA: Diagnosis not present

## 2021-06-25 DIAGNOSIS — J329 Chronic sinusitis, unspecified: Secondary | ICD-10-CM

## 2021-06-25 IMAGING — CR DG FOOT COMPLETE 3+V*R*
1 series · 3 of 3 positions shown · non-contrast
Comparison: None.

CLINICAL DATA: Chronic bilateral foot pain which has worsened over
the past 2 months.

EXAM:
LEFT FOOT - COMPLETE 3+ VIEW; RIGHT FOOT COMPLETE - 3+ VIEW

[Series 1: dg foot complete right · 0.14mm/px · 3 of 3 slices shown]
[im 1/3]
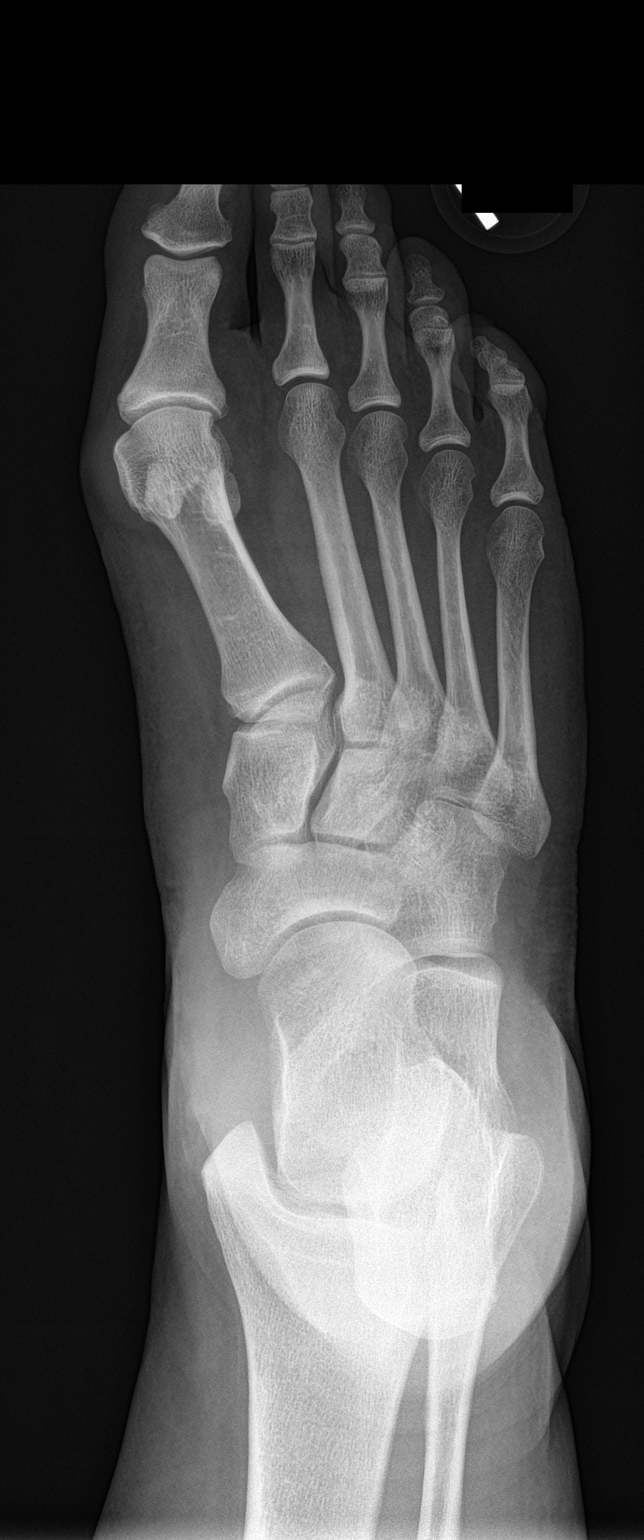
[im 2/3]
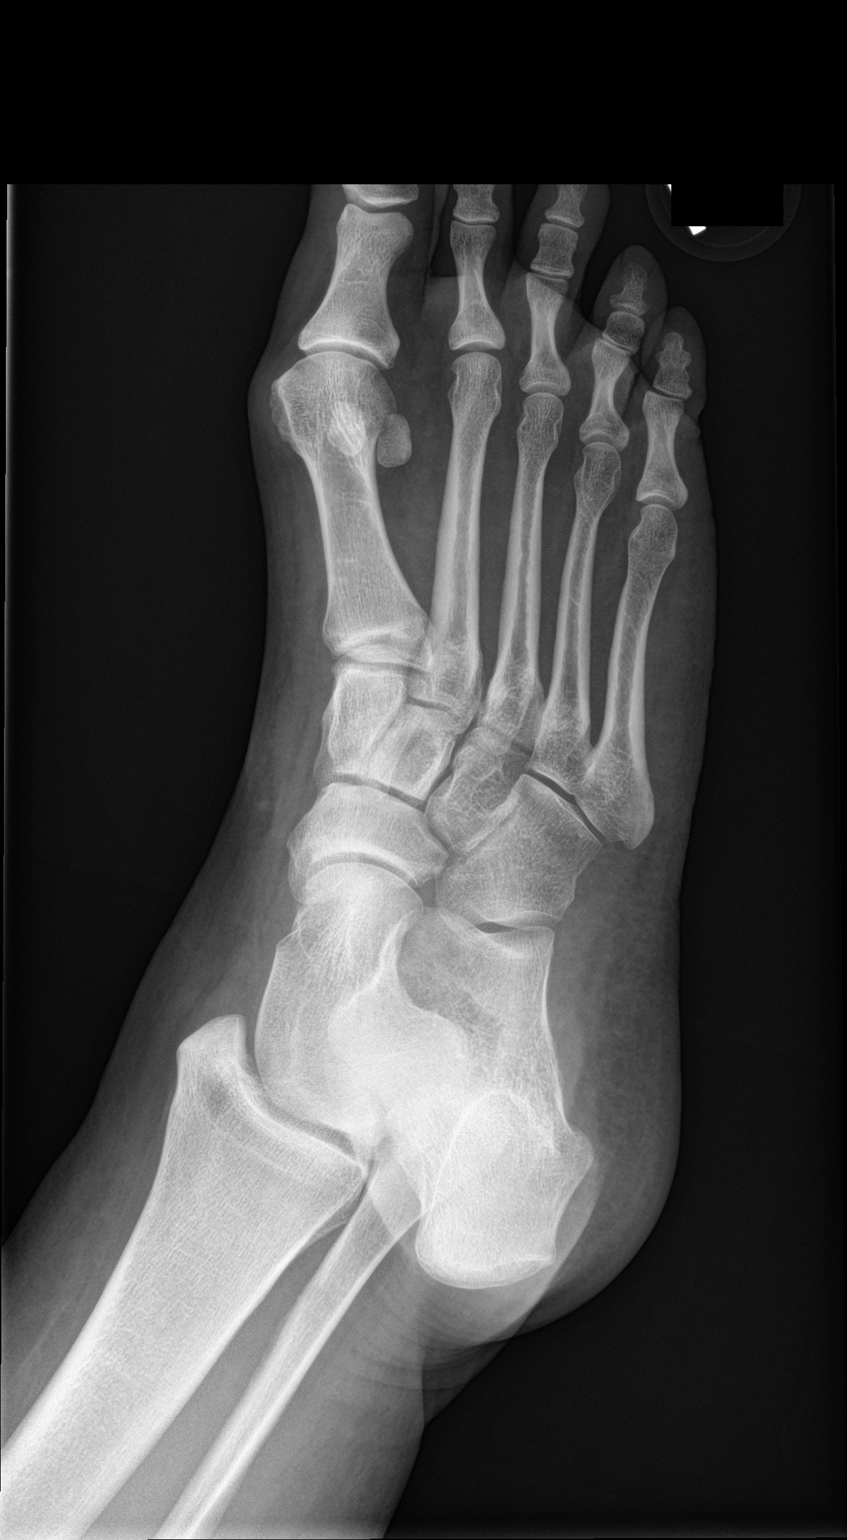
[im 3/3]
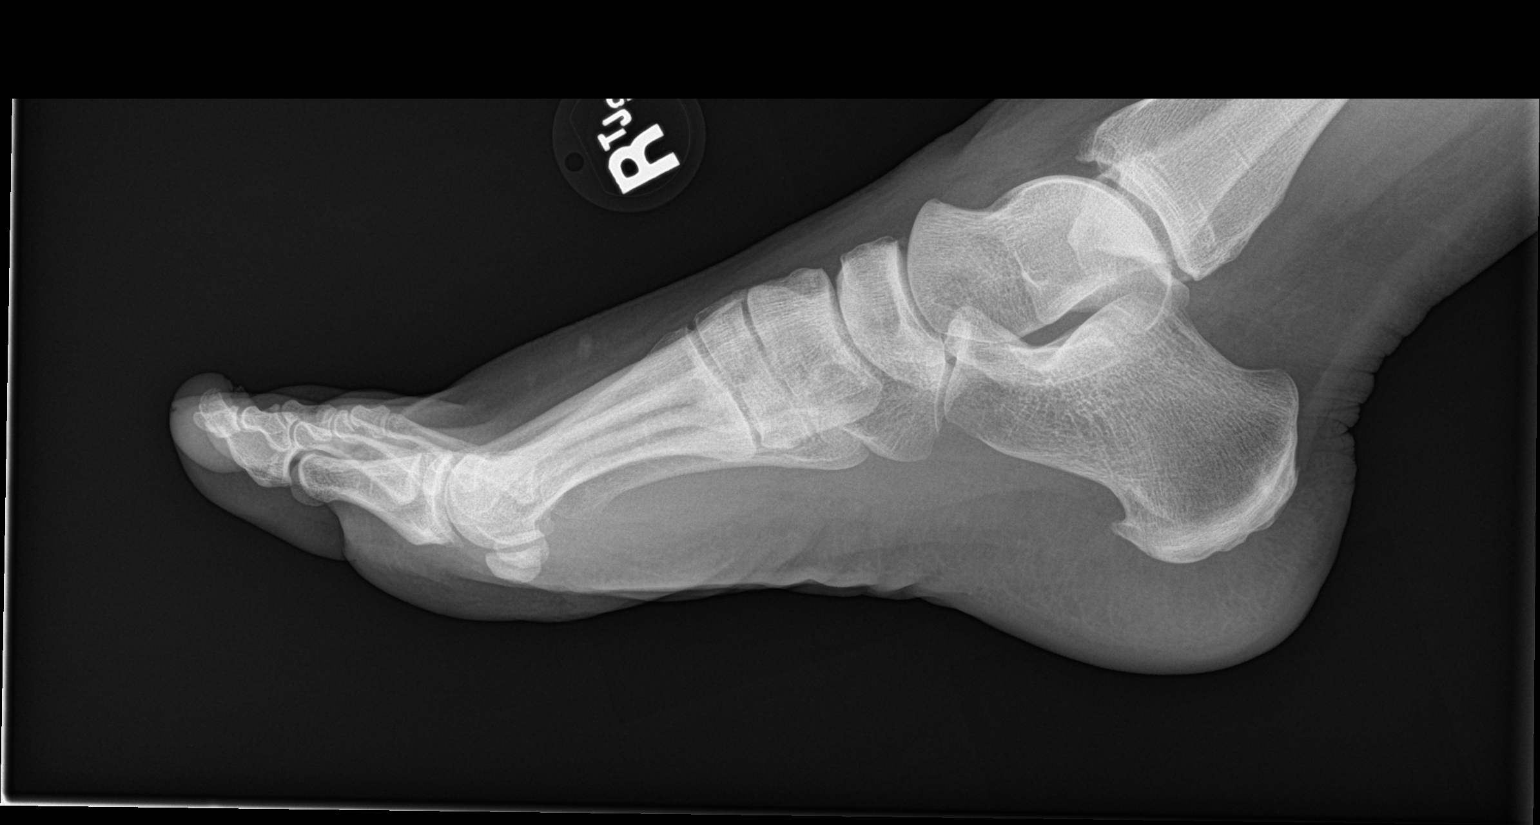

[3 of 3 positions shown; findings below may reference images not displayed]

FINDINGS: There is no acute bony or joint abnormality. Bilateral hallux valgus
deformities are seen, greater on the left. No evidence of
arthropathy. Small bilateral plantar calcaneal spurs noted.
IMPRESSION: No acute abnormality.

Bilateral hallux valgus, greater on the left.

Small bilateral plantar calcaneal spur.

## 2021-06-25 MED ORDER — AMOXICILLIN 500 MG PO CAPS: 1000.0000 mg | ORAL_CAPSULE | Freq: Two times a day (BID) | ORAL | 0 refills | Status: AC

## 2021-06-25 NOTE — Progress Notes (Signed)
Established patient visit   Patient: Denise Contreras   DOB: 09-26-1990   30 y.o. Female  MRN: [id number] Visit Date: 06/25/2021  Today's healthcare provider: Lelon Huh, MD   Chief Complaint  Patient presents with   Sinusitis   Subjective    Sinusitis This is a new problem. The current episode started in the past 7 days. The problem has been gradually worsening since onset. There has been no fever. Associated symptoms include chills, congestion, coughing, headaches, shortness of breath, sinus pressure and a sore throat. Pertinent negatives include no diaphoresis or ear pain. Past treatments include acetaminophen. The treatment provided no relief.   She states she was having some body aches, sore throat and fever last week, which has resolved.     Medications: Outpatient Medications Prior to Visit  Medication Sig   albuterol (VENTOLIN HFA) 108 (90 Base) MCG/ACT inhaler Inhale into the lungs every 6 (six) hours as needed for wheezing or shortness of breath.   Biotin 5 MG TBDP    cetirizine (ZYRTEC) 10 MG tablet Take 1 tablet (10 mg total) by mouth daily.   cholecalciferol (VITAMIN D3) 25 MCG (1000 UNIT) tablet Take 2,000 Units by mouth daily.   ELDERBERRY PO Take 1 tablet by mouth daily.   ipratropium-albuterol (DUONEB) 0.5-2.5 (3) MG/3ML SOLN Take 3 mLs by nebulization every 6 (six) hours as needed.   Multiple Vitamin (MULTIVITAMIN) tablet Take 1 tablet by mouth daily.   olmesartan (BENICAR) 20 MG tablet Take 1 tablet (20 mg total) by mouth daily.   buPROPion (WELLBUTRIN SR) 150 MG 12 hr tablet Take 1 tablet (150 mg total) by mouth 2 (two) times daily. (Patient not taking: Reported on 06/25/2021)   meloxicam (MOBIC) 15 MG tablet Take 1 tablet (15 mg total) by mouth daily.   predniSONE (DELTASONE) 10 MG tablet Take  4 each am x 2 days,   2 each am x 2 days,  1 each am x 2 days and stop   Spacer/Aero-Holding Chambers (AEROCHAMBER MV) inhaler Use as instructed (Patient not  taking: Reported on 06/25/2021)   No facility-administered medications prior to visit.    Review of Systems  Constitutional:  Positive for chills and fatigue. Negative for activity change, appetite change, diaphoresis, fever and unexpected weight change.  HENT:  Positive for congestion, postnasal drip, sinus pressure, sinus pain and sore throat. Negative for ear discharge, ear pain and rhinorrhea.   Eyes: Negative.   Respiratory:  Positive for cough, shortness of breath and wheezing. Negative for apnea, choking, chest tightness and stridor.   Gastrointestinal: Negative.   Musculoskeletal:  Positive for myalgias.  Neurological:  Positive for headaches. Negative for dizziness and light-headedness.      Objective    BP (!) 141/96 (BP Location: Right Arm, Patient Position: Sitting, Cuff Size: Large)   Pulse 77   Temp 98.4 F (36.9 C) (Oral)   Wt (!) 340 lb (154.2 kg)   SpO2 98%   BMI 62.19 kg/m    Physical Exam  General Appearance:    Obese female, alert, cooperative, in no acute distress  HENT:   ENT exam normal, no neck nodes. Tender right frontal and maxillary sinuses.   Eyes:    PERRL, conjunctiva/corneas clear, EOM's intact       Lungs:     Mild expiratory wheezes, no rales, respirations unlabored  Heart:    Normal heart rate. Normal rhythm. No murmurs, rubs, or gallops.    Neurologic:   Awake,  alert, oriented x 3. No apparent focal neurological           defect.         Assessment & Plan     1. Sinusitis, unspecified chronicity, unspecified location  - amoxicillin (AMOXIL) 500 MG capsule; Take 2 capsules (1,000 mg total) by mouth 2 (two) times daily for 10 days.  Dispense: 40 capsule; Refill: 0  2. Mild intermittent asthma without complication Mild exacerbation, doing well with albuterol inhaler.    She declined flu vaccine.      The entirety of the information documented in the History of Present Illness, Review of Systems and Physical Exam were personally obtained  by me. Portions of this information were initially documented by the CMA and reviewed by me for thoroughness and accuracy.     Lelon Huh, MD  [facility] [phone] (phone) [phone] (fax)  [place]

## 2021-06-25 NOTE — Telephone Encounter (Signed)
Spoke to patient, who is questioning if she can do allergy test the same day as her appt with MW.  I do not see where additional allergy test have been ordered or mentioned.  According to IGE and CBC result note, Dr. Melvyn Novas wanted patient to f/u to discuss results and tx plan further.  Patient is aware and voiced her understanding.  Nothing further needed at this time.

## 2021-06-29 ENCOUNTER — Telehealth: Payer: Medicaid Other | Admitting: Nurse Practitioner

## 2021-06-29 DIAGNOSIS — R6889 Other general symptoms and signs: Secondary | ICD-10-CM

## 2021-06-30 ENCOUNTER — Other Ambulatory Visit: Payer: Self-pay

## 2021-06-30 ENCOUNTER — Ambulatory Visit
Admission: EM | Admit: 2021-06-30 | Discharge: 2021-06-30 | Discharge status: Against Medical Advice | Payer: Medicaid Other

## 2021-06-30 ENCOUNTER — Telehealth: Payer: Self-pay | Admitting: Emergency Medicine

## 2021-06-30 ENCOUNTER — Telehealth: Payer: Medicaid Other | Admitting: Emergency Medicine

## 2021-06-30 ENCOUNTER — Encounter: Payer: Medicaid Other | Admitting: Nurse Practitioner

## 2021-06-30 DIAGNOSIS — R6889 Other general symptoms and signs: Secondary | ICD-10-CM

## 2021-06-30 MED ORDER — OSELTAMIVIR PHOSPHATE 75 MG PO CAPS: 75.0000 mg | ORAL_CAPSULE | Freq: Two times a day (BID) | ORAL | 0 refills | Status: DC

## 2021-06-30 NOTE — Progress Notes (Signed)
Hi Kenslee.  I'm sorry you are not feeling well. Based on what you shared with me (that you are short of breath and that you feel you need be seen by a physician urgently), I feel your condition warrants further evaluation in person and I recommend that you be seen in a face to face visit. Please go to your nearest urgent care or emergency room.  If you do not feel comfortable driving or do not have transportation please call 911.     NOTE: There will be NO CHARGE for this eVisit   If you are having a true medical emergency please call 911.      For an urgent face to face visit, [place] has six urgent care centers for your convenience:     [facility] [place] Get Driving Directions [phone] [place], [place] [address]    [facility]) Get Driving Directions [phone] [place], [place] [address]  [facility] ([place]) Get Driving Directions [phone] [address] [place],  [place]  [address]  [facility] Get Driving Directions [phone] [place], [place], [place] [address]   [facility] Get Driving Directions  [phone] [address], [place] [address]   [facility] [place] Get Driving Directions [phone] [address] [place],  [address]  Your MyChart E-visit questionnaire answers were reviewed by a board certified advanced clinical practitioner to complete your personal care plan based on your specific symptoms.  Thank you for using e-Visits.

## 2021-06-30 NOTE — Progress Notes (Signed)
This visit was for her daughter

## 2021-06-30 NOTE — Progress Notes (Signed)

## 2021-06-30 NOTE — Telephone Encounter (Signed)
Pt states she had an e visit yesterday and was told that Tamiflu would be sent to the pharmacy but she did not receive it. Rx sent to pharmacy per physicians note

## 2021-06-30 NOTE — Addendum Note (Signed)
Addended by: Chevis Pretty on: 06/30/2021 11:04 AM   Modules accepted: Orders

## 2021-07-03 ENCOUNTER — Ambulatory Visit (INDEPENDENT_AMBULATORY_CARE_PROVIDER_SITE_OTHER): Payer: Medicaid Other | Admitting: Internal Medicine

## 2021-07-03 ENCOUNTER — Encounter: Payer: Self-pay | Admitting: Internal Medicine

## 2021-07-03 ENCOUNTER — Other Ambulatory Visit: Payer: Self-pay

## 2021-07-03 DIAGNOSIS — J4520 Mild intermittent asthma, uncomplicated: Secondary | ICD-10-CM

## 2021-07-03 DIAGNOSIS — I10 Essential (primary) hypertension: Secondary | ICD-10-CM | POA: Diagnosis not present

## 2021-07-03 MED ORDER — BUDESONIDE-FORMOTEROL FUMARATE 80-4.5 MCG/ACT IN AERO: INHALATION_SPRAY | RESPIRATORY_TRACT | 12 refills | Status: DC

## 2021-07-03 NOTE — Assessment & Plan Note (Addendum)
D/c'd acei 06/06/21 due to ? pseudowheeze   Although even in retrospect it may not be clear the ACEi contributed to the pt's symptoms,  Pt improved off them and adding them back at this point or in the future would risk confusion in interpretation of non-specific respiratory symptoms to which this patient is prone  ie  Better not to muddy the waters here.   >> f/u PCP on benicar 20 mg /day with good bp control          Each maintenance medication was reviewed in detail including emphasizing most importantly the difference between maintenance and prns and under what circumstances the prns are to be triggered using an action plan format where appropriate.  Total time for H and P, chart review, counseling, reviewing hfa/neb device(s) and generating customized AVS unique to this summary f/u office visit / same day charting  > 30 min

## 2021-07-03 NOTE — Progress Notes (Signed)
Denise Contreras, female    DOB: 1991-02-14,    MRN: [id number]   Brief patient profile:  41  yowf never smoker with h/o asthma before elementary school, completely cleared then started back up p 1st child born 2017 never smoker with  referred to pulmonary clinic in [facility]  06/06/2021 by Daneil Dan Payne/[facility]   for asthma eval  due to freq flares.       History of Present Illness  06/06/2021  Pulmonary/ 1st office eval/ Divine Hansley / [employer] on ACEi  Chief Complaint  Patient presents with   Consult    Hx of asthma. C/o sob and wheezing. Exposed to smoke.   Dyspnea:  mainly steps when doing well, doing 30 min of treadmill at speed 2 mph and incline 6  Cough: forces cough  not bringing  Sleep: bed is flat 2 pillows / once a week needs saba  SABA use: once a day avg hfa/ neb once a week.  Overall worse x one year  Better not and since last prednisone but back "wheezing again" /also overt HB taking otcs Rec Stop lisinopril and start olmesartan 20 mg one daily in its place Plan A = Automatic = Always=    Breathe clear air  (I will be happy to call you in symbicort 80 if not better soon)  Work on inhaler technique:     Plan B = Backup (to supplement plan A, not to replace it) Only use your albuterol inhaler as a rescue medication Plan C = Crisis (instead of Plan B but only if Plan B stops working) - only use your albuterol nebulizer if you first try Plan B and it fails to help > ok to use the nebulizer up to every 4 hours but if start needing it regularly call for immediate appointment Plan D = Deltasone  if ABC not working to your satisfaction  >>> Prednisone 10 mg take  4 each am x 2 days,   2 each am x 2 days,  1 each am x 2 days and stop  Weight control is  a matter of calorie balance  Please schedule a follow up office visit in 6 weeks, call sooner if needed with all medications /inhalers/ solutions in hand so we can verify exactly what you are taking.This includes all  medications from all doctors and over the counters    - Allergy profile 06/06/2021 >  Eos 0.4 /  IgE  2120   07/03/2021  f/u ov/Keena Dinse/ [facility] re: allergy/asthma vs ? Acei case    maint on no rx/ just prns  Chief Complaint  Patient presents with   Follow-up    Asthma  Dyspnea:  no longer treadmill / just sob on steps Cough: better than it was/ despite flu like symptoms x 2 since last ov > amox 6/10  and tamiflu and never prednisone Sleeping: able to lie flat bed/ on side / no longer need neb at night  SABA use: avg 2 x daily more with uri 02: not  Covid status:   vax x 2    No obvious day to day or daytime variability or assoc excess/ purulent sputum or mucus plugs or hemoptysis or cp or chest tightness, subjective wheeze or overt sinus or hb symptoms.   Sleep  without nocturnal  or early am exacerbation  of respiratory  c/o's or need for noct saba. Also denies any obvious fluctuation of symptoms with weather or environmental changes or other aggravating  or alleviating factors except as outlined above   No unusual exposure hx or h/o childhood pna  or knowledge of premature birth.  Current Allergies, Complete Past Medical History, Past Surgical History, Family History, and Social History were reviewed in [employer] record.  ROS  The following are not active complaints unless bolded Hoarseness, sore throat, dysphagia, dental problems, itching, sneezing,  nasal congestion or discharge of excess mucus or purulent secretions, ear ache,   fever, chills, sweats, unintended wt loss or wt gain, classically pleuritic or exertional cp,  orthopnea pnd or arm/hand swelling  or leg swelling, presyncope, palpitations, abdominal pain, anorexia, nausea, vomiting, diarrhea  or change in bowel habits or change in bladder habits, change in stools or change in urine, dysuria, hematuria,  rash, arthralgias, visual complaints, headache, numbness, weakness or ataxia or problems  with walking or coordination,  change in mood or  memory.        Current Meds  Medication Sig   albuterol (VENTOLIN HFA) 108 (90 Base) MCG/ACT inhaler Inhale into the lungs every 6 (six) hours as needed for wheezing or shortness of breath.   amoxicillin (AMOXIL) 500 MG capsule Take 2 capsules (1,000 mg total) by mouth 2 (two) times daily for 10 days.   Biotin 5 MG TBDP    cetirizine (ZYRTEC) 10 MG tablet Take 1 tablet (10 mg total) by mouth daily.   cholecalciferol (VITAMIN D3) 25 MCG (1000 UNIT) tablet Take 2,000 Units by mouth daily.   ipratropium-albuterol (DUONEB) 0.5-2.5 (3) MG/3ML SOLN Take 3 mLs by nebulization every 6 (six) hours as needed.   Multiple Vitamin (MULTIVITAMIN) tablet Take 1 tablet by mouth daily.   olmesartan (BENICAR) 20 MG tablet Take 1 tablet (20 mg total) by mouth daily.   oseltamivir (TAMIFLU) 75 MG capsule Take 1 capsule (75 mg total) by mouth every 12 (twelve) hours.   predniSONE (DELTASONE) 10 MG tablet Take  4 each am x 2 days,   2 each am x 2 days,  1 each am x 2 days and stop   Spacer/Aero-Holding Chambers (AEROCHAMBER MV) inhaler Use as instructed             Past Medical History:  Diagnosis Date   Allergy    Asthma    Depression    Hypertension         Objective:     Wt Readings from Last 3 Encounters:  07/03/21 (!) 340 lb 9.6 oz (154.5 kg)  06/25/21 (!) 340 lb (154.2 kg)  06/06/21 (!) 338 lb (153.3 kg)      Vital signs reviewed  07/03/2021  - Note at rest 02 sats  98% on RA   General appearance:    amb pleasant MO wf with nasal tone to voice    HEENT : pt wearing mask not removed for exam due to covid -19 concerns.    NECK :  without JVD/Nodes/TM/ nl carotid upstrokes bilaterally   LUNGS: no acc muscle use,  Nl contour chest which is clear to A and P bilaterally without cough on insp or exp maneuvers   CV:  RRR  no s3 or murmur or increase in P2, and no edema   ABD:  soft and nontender with nl inspiratory excursion in the  supine position. No bruits or organomegaly appreciated, bowel sounds nl  MS:  Nl gait/ ext warm without deformities, calf tenderness, cyanosis or clubbing No obvious joint restrictions   SKIN: warm and dry without lesions  NEURO:  alert, approp, nl sensorium with  no motor or cerebellar deficits apparent.            Assessment

## 2021-07-03 NOTE — Assessment & Plan Note (Addendum)
Asthma as child then recurred 2017 - Allergy profile 06/06/2021 >  Eos 0.4 /  IgE  2120 > referred to allergy 07/03/2021  - try off acei 06/06/2021  > still needing  Bid saba as of 07/03/2021 > start symbicort 80 2bid   Reviewed rule of 2's and started on maint rx with Symbicort with ABCD plan - see avs for instructions unique to this ov  - The proper method of use, as well as anticipated side effects, of a metered-dose inhaler were discussed and demonstrated to the patient using teach back method   Once established with allergy then pulmonary f/u is prn

## 2021-07-03 NOTE — Patient Instructions (Addendum)
   Plan A = Automatic = Always=   Symbicort 80 Take 2 puffs first thing in am and then another 2 puffs about 12 hours later.    Work on inhaler technique:  relax and gently blow all the way out then take a nice smooth full deep breath back in, triggering the inhaler at same time you start breathing in.  Hold for up to 5 seconds if you can. Blow symbicort out thru nose. Rinse and gargle with water when done.  If mouth or throat bother you at all,  try brushing teeth/gums/tongue with arm and hammer toothpaste/ make a slurry and gargle and spit out.      Plan B = Backup (to supplement plan A, not to replace it) Only use your albuterol inhaler as a rescue medication to be used if you can't catch your breath by resting or doing a relaxed purse lip breathing pattern.  - The less you use it, the better it will work when you need it. - Ok to use the inhaler up to 2 puffs  every 4 hours if you must but call for appointment if use goes up over your usual need - Don't leave home without it !!  (think of it like the spare tire for your car)   Plan C = Crisis (instead of Plan B but only if Plan B stops working) - only use your albuterol nebulizer if you first try Plan B and it fails to help > ok to use the nebulizer up to every 4 hours but if start needing it regularly call for immediate appointment   Plan D = Deltasone  if ABC not working to your satisfaction  >>> Prednisone 10 mg take  4 each am x 2 days,   2 each am x 2 days,  1 each am x 2 days and stop         I am referring to an allergist in [place]   If not doing great let allergy refill your medication, if not then please return here with all your medications and inhalers in hand

## 2021-08-01 ENCOUNTER — Encounter: Payer: Medicaid Other | Admitting: Podiatry

## 2021-08-08 ENCOUNTER — Encounter: Payer: Self-pay | Admitting: Certified Nurse Midwife

## 2021-08-08 ENCOUNTER — Other Ambulatory Visit: Payer: Self-pay

## 2021-08-08 ENCOUNTER — Ambulatory Visit (INDEPENDENT_AMBULATORY_CARE_PROVIDER_SITE_OTHER): Payer: Medicaid Other | Admitting: Certified Nurse Midwife

## 2021-08-08 VITALS — BP 150/102 | HR 78 | Ht 63.0 in | Wt 346.0 lb

## 2021-08-08 DIAGNOSIS — R4586 Emotional lability: Secondary | ICD-10-CM | POA: Diagnosis not present

## 2021-08-08 DIAGNOSIS — R5383 Other fatigue: Secondary | ICD-10-CM

## 2021-08-08 NOTE — Patient Instructions (Signed)
Kegel Exercises Kegel exercises can help strengthen your pelvic floor muscles. The pelvic floor is a group of muscles that support your rectum, small intestine, and bladder. In females, pelvic floor muscles also help support the uterus. These muscles help you control the flow of urine and stool (feces). Kegel exercises are painless and simple. They do not require any equipment. Your provider may suggest Kegel exercises to: Improve bladder and bowel control. Improve sexual response. Improve weak pelvic floor muscles after surgery to remove the uterus (hysterectomy) or after pregnancy, in females. Improve weak pelvic floor muscles after prostate gland removal or surgery, in males. Kegel exercises involve squeezing your pelvic floor muscles. These are the same muscles you squeeze when you try to stop the flow of urine or keep from passing gas. The exercises can be done while sitting, standing, or lying down, but it is best to vary your position. Ask your health care provider which exercises are safe for you. Do exercises exactly as told by your health care provider and adjust them as directed. Do not begin these exercises until told by your health care provider. Exercises How to do Kegel exercises: Squeeze your pelvic floor muscles tight. You should feel a tight lift in your rectal area. If you are a female, you should also feel a tightness in your vaginal area. Keep your stomach, buttocks, and legs relaxed. Hold the muscles tight for up to 10 seconds. Breathe normally. Relax your muscles for up to 10 seconds. Repeat as told by your health care provider. Repeat this exercise daily as told by your health care provider. Continue to do this exercise for at least 4-6 weeks, or for as long as told by your health care provider. You may be referred to a physical therapist who can help you learn more about how to do Kegel exercises. Depending on your condition, your health care provider may  recommend: Varying how long you squeeze your muscles. Doing several sets of exercises every day. Doing exercises for several weeks. Making Kegel exercises a part of your regular exercise routine. This information is not intended to replace advice given to you by your health care provider. Make sure you discuss any questions you have with your health care provider. Document Revised: 11/23/2020 Document Reviewed: 11/23/2020 Elsevier Patient Education  2022 [employer].

## 2021-08-08 NOTE — Progress Notes (Signed)
GYN ENCOUNTER NOTE  Subjective:       Denise Contreras is a 31 y.o. G67P2002 female is here for gynecologic evaluation of the following issues:  1. Mood changes, pt state she has noticed in 2014 significant increase in depression with her cycle. She does not want to do anything. She also has increase in food cravings. States that she has history of depression that she was on Wellbutrin for but stopped because she does not like taking medications.      Obstetric History OB History  Gravida Para Term Preterm AB Living  2 2 2     2   SAB IAB Ectopic Multiple Live Births               # Outcome Date GA Lbr Len/2nd Weight Sex Delivery Anes PTL Lv  2 Term           1 Term             Past Medical History:  Diagnosis Date   Allergy    Asthma    Depression    Hypertension     Past Surgical History:  Procedure Laterality Date   CESAREAN SECTION      Current Outpatient Medications on File Prior to Visit  Medication Sig Dispense Refill   albuterol (VENTOLIN HFA) 108 (90 Base) MCG/ACT inhaler Inhale into the lungs every 6 (six) hours as needed for wheezing or shortness of breath.     Biotin 5 MG TBDP      budesonide-formoterol (SYMBICORT) 80-4.5 MCG/ACT inhaler Take 2 puffs first thing in am and then another 2 puffs about 12 hours later. 1 each 12   cetirizine (ZYRTEC) 10 MG tablet Take 1 tablet (10 mg total) by mouth daily. 30 tablet 11   cholecalciferol (VITAMIN D3) 25 MCG (1000 UNIT) tablet Take 2,000 Units by mouth daily.     ipratropium-albuterol (DUONEB) 0.5-2.5 (3) MG/3ML SOLN Take 3 mLs by nebulization every 6 (six) hours as needed. 360 mL 1   Multiple Vitamin (MULTIVITAMIN) tablet Take 1 tablet by mouth daily.     olmesartan (BENICAR) 20 MG tablet Take 1 tablet (20 mg total) by mouth daily. 30 tablet 11   oseltamivir (TAMIFLU) 75 MG capsule Take 1 capsule (75 mg total) by mouth every 12 (twelve) hours. 10 capsule 0   predniSONE (DELTASONE) 10 MG tablet Take  4 each am x 2  days,   2 each am x 2 days,  1 each am x 2 days and stop 14 tablet 0   Spacer/Aero-Holding Chambers (AEROCHAMBER MV) inhaler Use as instructed 1 each 2   No current facility-administered medications on file prior to visit.    Allergies  Allergen Reactions   Sulfa Antibiotics    Nickel Rash    Social History   Socioeconomic History   Marital status: Single    Spouse name: Not on file   Number of children: Not on file   Years of education: Not on file   Highest education level: Not on file  Occupational History   Not on file  Tobacco Use   Smoking status: Never   Smokeless tobacco: Never  Vaping Use   Vaping Use: Never used  Substance and Sexual Activity   Alcohol use: Not Currently   Drug use: Never   Sexual activity: Yes    Birth control/protection: None  Other Topics Concern   Not on file  Social History Narrative   Not on file   Social Determinants  of Health   Financial Resource Strain: Not on file  Food Insecurity: Not on file  Transportation Needs: Not on file  Physical Activity: Not on file  Stress: Not on file  Social Connections: Not on file  Intimate Partner Violence: Not on file    Family History  Problem Relation Age of Onset   COPD Mother    Hypertension Mother    Alcohol abuse Mother    Cancer Father    Hypertension Father    Alcohol abuse Paternal Uncle    Alcohol abuse Maternal Grandmother    COPD Maternal Grandmother    Alcohol abuse Maternal Grandfather    Hypertension Paternal Grandmother    Stroke Paternal Grandmother    Cancer Paternal Grandfather     The following portions of the patient's history were reviewed and updated as appropriate: allergies, current medications, past family history, past medical history, past social history, past surgical history and problem list.  Review of Systems Review of Systems - Negative except except as mentioned in HPI Review of Systems - General ROS: negative for - chills, fatigue, fever, hot  flashes, malaise or night sweats Hematological and Lymphatic ROS: negative for - bleeding problems or swollen lymph nodes Gastrointestinal ROS: negative for - abdominal pain, blood in stools, change in bowel habits and nausea/vomiting Musculoskeletal ROS: negative for - joint pain, muscle pain or muscular weakness Genito-Urinary ROS: negative for - change in menstrual cycle, dysmenorrhea, dyspareunia, dysuria, genital discharge, genital ulcers, hematuria, incontinence, irregular/heavy menses, nocturia or pelvic pain  Objective:   BP (!) 150/102    Pulse 78    Ht 5\' 3"  (1.6 m)    Wt (!) 346 lb (156.9 kg)    LMP  (LMP Unknown)    BMI 61.29 kg/m  CONSTITUTIONAL: Well-developed, well-nourished female in no acute distress.  HENT:  Normocephalic, atraumatic.  NECK: Normal range of motion, supple, no masses.  Normal thyroid.  SKIN: Skin is warm and dry. No rash noted. Not diaphoretic. No erythema. No pallor. Rebecca: Alert and oriented to person, place, and time. PSYCHIATRIC: Normal mood and affect. Normal behavior. Normal judgment and thought content. CARDIOVASCULAR:Not Examined RESPIRATORY: Not Examined BREASTS: Not Examined ABDOMEN: Soft, non distended; Non tender.  No Organomegaly. PELVIC:not indicated MUSCULOSKELETAL: Normal range of motion. No tenderness.  No cyanosis, clubbing, or edema.   Gad 11 Phq9 20  Assessment:   Mood swings    Plan:   Discussed probable premenstrual mood disorder due to mood swings and fatigue she feels with her cycle.  She declines medications at this time states she does not like taking medications. Suggestion natural remedies exercise, meditation, counseling. She is in agreement. Orders placed. Discussed should she change her mind about medications to let me know. We discussed zoloft. Discussed birth control options to help control her cycle. She is undecided at this time. On today's visit BP elevated, repeat better but still elevated. She states she did  not take her BP meds today. Encouraged pt to take medications and retake BP at home.   Philip Aspen, CNM

## 2021-08-09 DIAGNOSIS — J301 Allergic rhinitis due to pollen: Secondary | ICD-10-CM | POA: Diagnosis not present

## 2021-08-09 DIAGNOSIS — J4530 Mild persistent asthma, uncomplicated: Secondary | ICD-10-CM | POA: Diagnosis not present

## 2021-08-09 DIAGNOSIS — J3089 Other allergic rhinitis: Secondary | ICD-10-CM | POA: Diagnosis not present

## 2021-08-09 DIAGNOSIS — J3081 Allergic rhinitis due to animal (cat) (dog) hair and dander: Secondary | ICD-10-CM | POA: Diagnosis not present

## 2021-08-14 ENCOUNTER — Telehealth: Payer: Self-pay | Admitting: Family Medicine

## 2021-08-14 DIAGNOSIS — R239 Unspecified skin changes: Secondary | ICD-10-CM

## 2021-08-14 NOTE — Telephone Encounter (Signed)
Copied from [place] [id number] [id number]. Topic: Referral - Request for Referral >> Aug 14, 2021  2:46 PM Leward Quan A wrote: Has patient seen PCP for this complaint? No. *If NO, is insurance requiring patient see PCP for this issue before PCP can refer them? Referral for which specialty: Dermatology Preferred provider/office: anyone in  the area that accepts her insurance  Reason for referral: spot on left leg above ankle for a few months

## 2021-08-16 ENCOUNTER — Telehealth: Payer: Self-pay

## 2021-08-16 NOTE — Telephone Encounter (Signed)
LMTCB-Ok for [facility] nurse to schedule patient with Tally Joe for elevated BP.

## 2021-08-16 NOTE — Telephone Encounter (Signed)
-----   Message from Gwyneth Sprout, FNP sent at 08/08/2021  9:59 AM EST ----- Please recommend pt to come in for OV given elevated BP at OB ----- Message ----- From: Philip Aspen, CNM Sent: 08/08/2021   9:06 AM EST To: Gwyneth Sprout, FNP

## 2021-08-20 ENCOUNTER — Other Ambulatory Visit: Payer: Self-pay | Admitting: Family Medicine

## 2021-08-20 MED ORDER — CETIRIZINE HCL 10 MG PO TABS: 10.0000 mg | ORAL_TABLET | Freq: Every day | ORAL | 2 refills | Status: DC

## 2021-08-20 NOTE — Telephone Encounter (Signed)
Medication Refill - Medication: cetirizine (ZYRTEC) 10 MG tablet Pt stated she needs her refills transferred to the new pharmacy. Stated she cannot afford them from [place].   Has the patient contacted their pharmacy? Yes.   Pt new pharmacy advised pt to contact her PCP as they do not have refills.   (Agent: If yes, when and what did the pharmacy advise?)  Preferred Pharmacy (with phone number or street name):   [place], [place] [address]  [address] [place] [address]  Phone: [phone] Fax: [phone]  Hours: Not open 24 hours    Has the patient been seen for an appointment in the last year OR does the patient have an upcoming appointment? Yes.    Agent: Please be advised that RX refills may take up to 3 business days. We ask that you follow-up with your pharmacy.

## 2021-08-20 NOTE — Telephone Encounter (Signed)
Requested Prescriptions  Pending Prescriptions Disp Refills   cetirizine (ZYRTEC) 10 MG tablet 30 tablet 2    Sig: Take 1 tablet (10 mg total) by mouth daily.     Ear, Nose, and Throat:  Antihistamines Passed - 08/20/2021  4:57 PM      Passed - Valid encounter within last 12 months    Recent Outpatient Visits          1 month ago Sinusitis, unspecified chronicity, unspecified location   [place], MD   2 months ago Right wrist pain   [facility] [place], Dionne Bucy, MD   5 months ago Essential hypertension   [place], Vickki Muff, PA-C   6 months ago No-show for appointment   [place], PA-C   6 months ago No-show for appointment   [place], PA-C      Future Appointments            In 3 days Bacigalupo, Dionne Bucy, MD [facility] [place]   In 1 month Ralene Bathe, MD [place]

## 2021-08-22 NOTE — Progress Notes (Deleted)
° ° °  MyChart Video Visit    Virtual Visit via Video Note   This visit type was conducted due to national recommendations for restrictions regarding the COVID-19 Pandemic (e.g. social distancing) in an effort to limit this patient's exposure and mitigate transmission in our community. This patient is at least at moderate risk for complications without adequate follow up. This format is felt to be most appropriate for this patient at this time. Physical exam was limited by quality of the video and audio technology used for the visit.   Patient location: *** Provider location: ***  I discussed the limitations of evaluation and management by telemedicine and the availability of in person appointments. The patient expressed understanding and agreed to proceed.  Patient: Denise Contreras   DOB: 09-01-1990   31 y.o. Female  MRN: [id number] Visit Date: 08/23/2021  Today's healthcare provider: Lavon Paganini, MD   No chief complaint on file.  Subjective    HPI  ***   Medications: Outpatient Medications Prior to Visit  Medication Sig   albuterol (VENTOLIN HFA) 108 (90 Base) MCG/ACT inhaler Inhale into the lungs every 6 (six) hours as needed for wheezing or shortness of breath.   Biotin 5 MG TBDP    budesonide-formoterol (SYMBICORT) 80-4.5 MCG/ACT inhaler Take 2 puffs first thing in am and then another 2 puffs about 12 hours later.   cetirizine (ZYRTEC) 10 MG tablet Take 1 tablet (10 mg total) by mouth daily.   cholecalciferol (VITAMIN D3) 25 MCG (1000 UNIT) tablet Take 2,000 Units by mouth daily.   ipratropium-albuterol (DUONEB) 0.5-2.5 (3) MG/3ML SOLN Take 3 mLs by nebulization every 6 (six) hours as needed.   Multiple Vitamin (MULTIVITAMIN) tablet Take 1 tablet by mouth daily.   olmesartan (BENICAR) 20 MG tablet Take 1 tablet (20 mg total) by mouth daily.   oseltamivir (TAMIFLU) 75 MG capsule Take 1 capsule (75 mg total) by mouth every 12 (twelve) hours.   predniSONE (DELTASONE) 10  MG tablet Take  4 each am x 2 days,   2 each am x 2 days,  1 each am x 2 days and stop   Spacer/Aero-Holding Chambers (AEROCHAMBER MV) inhaler Use as instructed   No facility-administered medications prior to visit.    Review of Systems  {Labs   Heme   Chem   Endocrine   Serology   Results Review (optional):23779}  Objective    LMP  (LMP Unknown)  {Show previous vital signs (optional):23777}  Physical Exam     Assessment & Plan     ***  No follow-ups on file.     I discussed the assessment and treatment plan with the patient. The patient was provided an opportunity to ask questions and all were answered. The patient agreed with the plan and demonstrated an understanding of the instructions.   The patient was advised to call back or seek an in-person evaluation if the symptoms worsen or if the condition fails to improve as anticipated.  I provided *** minutes of non-face-to-face time during this encounter.  {provider attestation***:1}  Lavon Paganini, MD [facility] [phone] (phone) [phone] (fax)  [place]

## 2021-08-23 ENCOUNTER — Ambulatory Visit: Payer: Medicaid Other | Admitting: Family Medicine

## 2021-08-23 ENCOUNTER — Other Ambulatory Visit: Payer: Self-pay

## 2021-08-23 NOTE — Telephone Encounter (Signed)
Pt called in after receiving call. Pt says that she was unable to get off work by the time she received call. Pt rescheduled for Monday with Dr. Caryn Section.

## 2021-08-27 ENCOUNTER — Encounter: Payer: Self-pay | Admitting: Family Medicine

## 2021-08-27 ENCOUNTER — Ambulatory Visit
Admission: RE | Admit: 2021-08-27 | Discharge: 2021-08-27 | Disposition: A | Discharge status: Home / Self Care | Payer: Medicaid Other | Attending: Family Medicine | Admitting: Family Medicine

## 2021-08-27 ENCOUNTER — Other Ambulatory Visit: Payer: Self-pay

## 2021-08-27 ENCOUNTER — Ambulatory Visit (INDEPENDENT_AMBULATORY_CARE_PROVIDER_SITE_OTHER): Payer: Medicaid Other | Admitting: Family Medicine

## 2021-08-27 ENCOUNTER — Ambulatory Visit
Admission: RE | Admit: 2021-08-27 | Discharge: 2021-08-27 | Disposition: A | Discharge status: Home / Self Care | Payer: Medicaid Other | Source: Ambulatory Visit | Attending: Family Medicine | Admitting: Family Medicine

## 2021-08-27 VITALS — BP 132/89 | HR 86 | Temp 98.4°F | Resp 16 | Wt 350.0 lb

## 2021-08-27 DIAGNOSIS — M25531 Pain in right wrist: Secondary | ICD-10-CM | POA: Diagnosis present

## 2021-08-27 DIAGNOSIS — R109 Unspecified abdominal pain: Secondary | ICD-10-CM | POA: Diagnosis not present

## 2021-08-27 DIAGNOSIS — R112 Nausea with vomiting, unspecified: Secondary | ICD-10-CM | POA: Diagnosis not present

## 2021-08-27 DIAGNOSIS — I10 Essential (primary) hypertension: Secondary | ICD-10-CM | POA: Diagnosis not present

## 2021-08-27 NOTE — Progress Notes (Signed)
Established patient visit   Patient: Denise Contreras   DOB: 1991-05-27   31 y.o. Female  MRN: [id number] Visit Date: 08/27/2021  Today's healthcare provider: Lelon Huh, MD   Chief Complaint  Patient presents with   Blood Pressure Check   Subjective    HPI  Elevated blood pressure: Patients blood pressure was elevated at a recent appointment at her GYN. Her blood pressure at that visit was 150/102. Patient was advised to follow up with her PCP. Patient states she thinks her blood pressure was elevated due to vomiting episodes she recently had.   Vomiting: Patient also comes in today reporting that 3 weeks ago she had an episode of vomiting and abdominal pain after eating pine apple, bacon and corned beef hash. She states she had diarrhea for 7 days after the vomiting episode. Since then patient states she has been burping up acid which tastes like rotten eggs. She also had several says of diarrhea which resolved about 2 days ago. She states that she never saw any blood on emesis or stool.   Wrist pain: Patient complains of right wrist pain. She has been seen for this problem in the past and was prescribed Meloxicam, which she reports provided no relief. The pain is on the ulnar side of her wrist over the ulnar styloid process. She did wear a wrist brace which seemed helpful, but interfered with her job saw she hasn't been able to wear it consistently.    Medications: Outpatient Medications Prior to Visit  Medication Sig   albuterol (VENTOLIN HFA) 108 (90 Base) MCG/ACT inhaler Inhale into the lungs every 6 (six) hours as needed for wheezing or shortness of breath.   Biotin 5 MG TBDP    budesonide-formoterol (SYMBICORT) 80-4.5 MCG/ACT inhaler Take 2 puffs first thing in am and then another 2 puffs about 12 hours later.   cetirizine (ZYRTEC) 10 MG tablet Take 1 tablet (10 mg total) by mouth daily.   cholecalciferol (VITAMIN D3) 25 MCG (1000 UNIT) tablet Take 2,000 Units by  mouth daily.   ipratropium-albuterol (DUONEB) 0.5-2.5 (3) MG/3ML SOLN Take 3 mLs by nebulization every 6 (six) hours as needed.   montelukast (SINGULAIR) 10 MG tablet Take 10 mg by mouth daily.   Multiple Vitamin (MULTIVITAMIN) tablet Take 1 tablet by mouth daily.   olmesartan (BENICAR) 20 MG tablet Take 1 tablet (20 mg total) by mouth daily.   [DISCONTINUED] oseltamivir (TAMIFLU) 75 MG capsule Take 1 capsule (75 mg total) by mouth every 12 (twelve) hours.   [DISCONTINUED] predniSONE (DELTASONE) 10 MG tablet Take  4 each am x 2 days,   2 each am x 2 days,  1 each am x 2 days and stop   [DISCONTINUED] Spacer/Aero-Holding Chambers (AEROCHAMBER MV) inhaler Use as instructed   No facility-administered medications prior to visit.    Review of Systems  Constitutional:  Negative for appetite change, chills, fatigue and fever.  Respiratory:  Negative for chest tightness and shortness of breath.   Cardiovascular:  Negative for chest pain and palpitations.  Gastrointestinal:  Positive for abdominal pain and vomiting (resolved). Negative for nausea.       Acid reflux  Musculoskeletal:  Positive for arthralgias (right wrist pain).  Neurological:  Negative for dizziness and weakness.      Objective    BP 132/89 (BP Location: Right Arm, Patient Position: Sitting, Cuff Size: Large)    Pulse 86    Temp 98.4 F (36.9 C) (Oral)  Resp 16    Wt (!) 350 lb (158.8 kg)    LMP  (LMP Unknown)    SpO2 100% Comment: room air   BMI 62.00 kg/m  {Show previous vital signs (optional):23777}  Physical Exam   General appearance: Severely obese female, cooperative and in no acute distress Head: Normocephalic, without obvious abnormality, atraumatic Respiratory: Respirations even and unlabored, normal respiratory rate MS:: Mild soft tissue swelling and tenderness over ulnar styloid process of right wrist. No erythema. FROM without pain.  Skin: Skin color, texture, turgor normal. No rashes seen  Psych: Appropriate  mood and affect. Neurologic: Mental status: Alert, oriented to person, place, and time, thought content appropriate.     Assessment & Plan     1. Nausea and vomiting, unspecified vomiting type Resolved today.  - CBC - Comprehensive metabolic panel  2. Abdominal pain, unspecified abdominal location Resolved today. Episodes were very brief and more in lower chest area. May have been related to persistent vomiting or possible some intermittent GERD. See how labs look.   3. Right wrist pain Persistent since at least October with no relief from meloxicam.  - DG Wrist Complete Right; Future  4. Hypertension Well controlled when she take valsartan consistently.  - Lipid panel       The entirety of the information documented in the History of Present Illness, Review of Systems and Physical Exam were personally obtained by me. Portions of this information were initially documented by the CMA and reviewed by me for thoroughness and accuracy.     Lelon Huh, MD  [facility] [phone] (phone) [phone] (fax)  [place]

## 2021-08-27 NOTE — Patient Instructions (Addendum)
Please review the attached list of medications and notify my office if there are any errors.   Go to the [facility] on [address] for right wrist Xray

## 2021-08-28 ENCOUNTER — Telehealth: Payer: Self-pay

## 2021-08-28 ENCOUNTER — Other Ambulatory Visit: Payer: Self-pay

## 2021-08-28 DIAGNOSIS — M25531 Pain in right wrist: Secondary | ICD-10-CM

## 2021-08-28 DIAGNOSIS — M25532 Pain in left wrist: Secondary | ICD-10-CM

## 2021-08-28 LAB — CBC
Hematocrit: 41.3 % (ref 34.0–46.6)
Hemoglobin: 13.2 g/dL (ref 11.1–15.9)
MCH: 27.6 pg (ref 26.6–33.0)
MCHC: 32 g/dL (ref 31.5–35.7)
MCV: 86 fL (ref 79–97)
Platelets: 317 10*3/uL (ref 150–450)
RBC: 4.79 x10E6/uL (ref 3.77–5.28)
RDW: 12.9 % (ref 11.7–15.4)
WBC: 7.3 10*3/uL (ref 3.4–10.8)

## 2021-08-28 LAB — COMPREHENSIVE METABOLIC PANEL
ALT: 20 IU/L (ref 0–32)
AST: 17 IU/L (ref 0–40)
Albumin/Globulin Ratio: 1.8 (ref 1.2–2.2)
Albumin: 4.4 g/dL (ref 3.9–5.0)
Alkaline Phosphatase: 80 IU/L (ref 44–121)
BUN/Creatinine Ratio: 16 (ref 9–23)
BUN: 14 mg/dL (ref 6–20)
Bilirubin Total: 0.3 mg/dL (ref 0.0–1.2)
CO2: 23 mmol/L (ref 20–29)
Calcium: 9.7 mg/dL (ref 8.7–10.2)
Chloride: 102 mmol/L (ref 96–106)
Creatinine, Ser: 0.87 mg/dL (ref 0.57–1.00)
Globulin, Total: 2.4 g/dL (ref 1.5–4.5)
Glucose: 87 mg/dL (ref 70–99)
Potassium: 4.7 mmol/L (ref 3.5–5.2)
Sodium: 138 mmol/L (ref 134–144)
Total Protein: 6.8 g/dL (ref 6.0–8.5)
eGFR: 92 mL/min/{1.73_m2} (ref >59)

## 2021-08-28 LAB — LIPID PANEL
Chol/HDL Ratio: 5.2 ratio — ABNORMAL HIGH (ref 0.0–4.4)
Cholesterol, Total: 193 mg/dL (ref 100–199)
HDL: 37 mg/dL — ABNORMAL LOW (ref >39)
LDL Chol Calc (NIH): 135 mg/dL — ABNORMAL HIGH (ref 0–99)
Triglycerides: 116 mg/dL (ref 0–149)
VLDL Cholesterol Cal: 21 mg/dL (ref 5–40)

## 2021-08-28 NOTE — Telephone Encounter (Signed)
Referral ordered    Copied from [place] [id number]. Topic: Referral - Question >> Aug 28, 2021 10:40 AM Pawlus, Brayton Layman A wrote: Reason for CRM: Pt was following up on a message sent by Dr Caryn Section under lab/imaging notes,  pt viewed on MyChart, pt stated she would like the referral to be sent in.

## 2021-09-03 ENCOUNTER — Telehealth: Payer: Self-pay

## 2021-09-03 MED ORDER — OMEPRAZOLE 20 MG PO CPDR: 20.0000 mg | DELAYED_RELEASE_CAPSULE | Freq: Every day | ORAL | 2 refills | Status: DC

## 2021-09-03 NOTE — Telephone Encounter (Signed)
-----   Message from [place] sent at 09/03/2021  9:29 AM EST ----- LMTCB, [facility] Triage Nurse may give patient results

## 2021-09-07 ENCOUNTER — Other Ambulatory Visit: Payer: Medicaid Other

## 2021-09-08 ENCOUNTER — Other Ambulatory Visit: Payer: Self-pay

## 2021-09-08 ENCOUNTER — Ambulatory Visit
Admission: EM | Admit: 2021-09-08 | Discharge: 2021-09-08 | Disposition: A | Discharge status: Home / Self Care | Payer: Medicaid Other | Attending: Emergency Medicine | Admitting: Emergency Medicine

## 2021-09-08 DIAGNOSIS — J029 Acute pharyngitis, unspecified: Secondary | ICD-10-CM | POA: Diagnosis not present

## 2021-09-08 LAB — GROUP A STREP BY PCR: Group A Strep by PCR: NOT DETECTED

## 2021-09-08 MED ORDER — IBUPROFEN 600 MG PO TABS: 600.0000 mg | ORAL_TABLET | Freq: Four times a day (QID) | ORAL | 0 refills | Status: DC | PRN

## 2021-09-08 MED ORDER — FLUTICASONE PROPIONATE 50 MCG/ACT NA SUSP: 2.0000 | Freq: Every day | NASAL | 0 refills | Status: DC

## 2021-09-08 NOTE — ED Provider Notes (Signed)
HPI  SUBJECTIVE:  Denise Contreras is a 31 y.o. female who presents with 2 days of sore throat, nasal congestion, rhinorrhea, mild cough.  No fevers, body aches, headaches, loss of sense of smell or taste, wheezing, shortness of breath, nausea, vomiting, diarrhea, abdominal pain, rash, drooling, trismus, neck stiffness, sensation of throat swelling shut, voice changes.  No allergy or GERD symptoms.  No strep, flu, COVID exposure, but her child is sick.  She got second dose of COVID-vaccine.  She did not get this years flu vaccine.  No antibiotics in the past month.  No antipyretic in the past 6 hours.  She tried Statistician.  Chloraseptic helps.  Symptoms are worse with swallowing.  She has a past medical history of asthma, hypertension, allergies and tonsillitis that she usually gets in late winter/early spring.  LMP: 1 week ago.  Denies the possibility being pregnant.  PMD: [facility]    Past Medical History:  Diagnosis Date   Allergy    Asthma    Depression    Hypertension     Past Surgical History:  Procedure Laterality Date   CESAREAN SECTION      Family History  Problem Relation Age of Onset   COPD Mother    Hypertension Mother    Alcohol abuse Mother    Cancer Father    Hypertension Father    Alcohol abuse Paternal Uncle    Alcohol abuse Maternal Grandmother    COPD Maternal Grandmother    Alcohol abuse Maternal Grandfather    Hypertension Paternal Grandmother    Stroke Paternal Grandmother    Cancer Paternal Grandfather     Social History   Tobacco Use   Smoking status: Never   Smokeless tobacco: Never  Vaping Use   Vaping Use: Never used  Substance Use Topics   Alcohol use: Not Currently   Drug use: Never    No current facility-administered medications for this encounter.  Current Outpatient Medications:    fluticasone (FLONASE) 50 MCG/ACT nasal spray, Place 2 sprays into both nostrils daily., Disp: 16 g, Rfl: 0    ibuprofen (ADVIL) 600 MG tablet, Take 1 tablet (600 mg total) by mouth every 6 (six) hours as needed., Disp: 30 tablet, Rfl: 0   albuterol (VENTOLIN HFA) 108 (90 Base) MCG/ACT inhaler, Inhale into the lungs every 6 (six) hours as needed for wheezing or shortness of breath., Disp: , Rfl:    Biotin 5 MG TBDP, , Disp: , Rfl:    budesonide-formoterol (SYMBICORT) 80-4.5 MCG/ACT inhaler, Take 2 puffs first thing in am and then another 2 puffs about 12 hours later., Disp: 1 each, Rfl: 12   cetirizine (ZYRTEC) 10 MG tablet, Take 1 tablet (10 mg total) by mouth daily., Disp: 30 tablet, Rfl: 2   cholecalciferol (VITAMIN D3) 25 MCG (1000 UNIT) tablet, Take 2,000 Units by mouth daily., Disp: , Rfl:    ipratropium-albuterol (DUONEB) 0.5-2.5 (3) MG/3ML SOLN, Take 3 mLs by nebulization every 6 (six) hours as needed., Disp: 360 mL, Rfl: 1   montelukast (SINGULAIR) 10 MG tablet, Take 10 mg by mouth daily., Disp: , Rfl:    Multiple Vitamin (MULTIVITAMIN) tablet, Take 1 tablet by mouth daily., Disp: , Rfl:    olmesartan (BENICAR) 20 MG tablet, Take 1 tablet (20 mg total) by mouth daily., Disp: 30 tablet, Rfl: 11   omeprazole (PRILOSEC) 20 MG capsule, Take 1 capsule (20 mg total) by mouth daily., Disp: 30 capsule, Rfl: 2  Allergies  Allergen Reactions   Sulfa Antibiotics    Nickel Rash     ROS  As noted in HPI.   Physical Exam  BP (!) 155/105 (BP Location: Right Arm) Comment: did not have BP meds   Pulse 78    Temp 98.3 F (36.8 C) (Oral)    Resp 18    LMP 08/25/2021    SpO2 98%   Constitutional: Well developed, well nourished, no acute distress Eyes:  EOMI, conjunctiva normal bilaterally HENT: Normocephalic, atraumatic,mucus membranes moist.  Positive clear nasal congestion.  Erythematous, swollen turbinates.  Erythematous, slightly swollen tonsils without exudates.  Uvula midline.  No appreciable postnasal drip. [place] no appreciable cervical lymphadenopathy Respiratory: Normal inspiratory effort, lungs  clear bilaterally Cardiovascular: Normal rate regular rhythm, no murmurs GI: nondistended skin: No rash, skin intact Musculoskeletal: no deformities Neurologic: Alert & oriented x 3, no focal neuro deficits Psychiatric: Speech and behavior appropriate   ED Course   Medications - No data to display  Orders Placed This Encounter  Procedures   Group A Strep by PCR    Standing Status:   Standing    Number of Occurrences:   1    Order Specific Question:   Patient immune status    Answer:   Normal    Results for orders placed or performed during the hospital encounter of 09/08/21 (from the past 24 hour(s))  Group A Strep by PCR     Status: None   Collection Time: 09/08/21  8:29 AM   Specimen: Throat; Sterile Swab  Result Value Ref Range   Group A Strep by PCR NOT DETECTED NOT DETECTED   No results found.  ED Clinical Impression  1. Viral pharyngitis      ED Assessment/Plan  Patient with a viral pharyngitis.  Strep PCR negative.  Will treat accordingly if daughters respiratory panel comes back positive.  Home with Flonase, saline nasal rogation, Benadryl/Maalox mixture.  Follow-up with PMD as needed.  Blood pressure noted.  She states that she did not take her blood pressure medications this morning when she is otherwise asymptomatic.  She will keep an eye on this and follow-up with her primary care provider  Discussed labs, MDM, treatment plan, and plan for follow-up with patient.  patient agrees with plan.   Meds ordered this encounter  Medications   fluticasone (FLONASE) 50 MCG/ACT nasal spray    Sig: Place 2 sprays into both nostrils daily.    Dispense:  16 g    Refill:  0   ibuprofen (ADVIL) 600 MG tablet    Sig: Take 1 tablet (600 mg total) by mouth every 6 (six) hours as needed.    Dispense:  30 tablet    Refill:  0      *This clinic note was created using Lobbyist. Therefore, there may be occasional mistakes despite careful  proofreading.  ?    Melynda Ripple, MD 09/10/21 [phone]

## 2021-09-08 NOTE — Discharge Instructions (Addendum)
I will contact you if Madeline's testing comes back abnormal.  Your strep PCR was negative.  Start Flonase, saline nasal irrigation with a NeilMed sinus rinse, nasal congestion.  You may take 1 gram of Tylenol and 600 mg ibuprofen together 3-4 times a day as needed for pain.  Make sure you drink plenty of extra fluids.  Some people find salt water gargles and  Traditional Medicinal's "Throat Coat" tea helpful. Take 5 mL of liquid Benadryl and 5 mL of Maalox. Mix it together, and then hold it in your mouth for as long as you can and then swallow. You may do this 4 times a day.    Go to www.goodrx.com  or www.costplusdrugs.com to look up your medications. This will give you a list of where you can find your prescriptions at the most affordable prices. Or ask the pharmacist what the cash price is, or if they have any other discount programs available to help make your medication more affordable. This can be less expensive than what you would pay with insurance.

## 2021-09-08 NOTE — ED Triage Notes (Signed)
Patient presents to Urgent Care with complaints of sore throat since yesterday. Pt states symptoms worse today.   Denies fever.

## 2021-09-22 ENCOUNTER — Encounter: Payer: Self-pay | Admitting: Family Medicine

## 2021-09-25 ENCOUNTER — Encounter: Payer: Self-pay | Admitting: Family Medicine

## 2021-10-08 ENCOUNTER — Other Ambulatory Visit: Payer: Medicaid Other

## 2021-10-14 ENCOUNTER — Other Ambulatory Visit: Payer: Self-pay | Admitting: Family Medicine

## 2021-10-15 ENCOUNTER — Telehealth: Payer: Self-pay | Admitting: Internal Medicine

## 2021-10-15 ENCOUNTER — Ambulatory Visit: Payer: Medicaid Other | Admitting: Dermatology

## 2021-10-15 ENCOUNTER — Other Ambulatory Visit: Payer: Self-pay | Admitting: Family Medicine

## 2021-10-15 MED ORDER — BUDESONIDE-FORMOTEROL FUMARATE 80-4.5 MCG/ACT IN AERO: INHALATION_SPRAY | RESPIRATORY_TRACT | 12 refills | Status: AC

## 2021-10-15 MED ORDER — OMEPRAZOLE 20 MG PO CPDR: 20.0000 mg | DELAYED_RELEASE_CAPSULE | Freq: Every day | ORAL | 2 refills | Status: DC

## 2021-10-15 NOTE — Telephone Encounter (Signed)
Spoke with pt and informed her she would have to contact pharmacy to have Symbicort Rx transferred to desired pharmacy. Pt stated understanding. Nothing further needed at this time.  ?

## 2021-10-15 NOTE — Telephone Encounter (Signed)
Requested Prescriptions  ?Pending Prescriptions Disp Refills  ?? omeprazole (PRILOSEC) 20 MG capsule 30 capsule 2  ?  Sig: Take 1 capsule (20 mg total) by mouth daily.  ?  ? Gastroenterology: Proton Pump Inhibitors Passed - 10/15/2021  9:37 AM  ?  ?  Passed - Valid encounter within last 12 months  ?  Recent Outpatient Visits   ?      ? 1 month ago Nausea and vomiting, unspecified vomiting type  ? [facility] Birdie Sons, MD  ? 3 months ago Sinusitis, unspecified chronicity, unspecified location  ? [facility] Birdie Sons, MD  ? 4 months ago Right wrist pain  ? [facility] [place], Dionne Bucy, MD  ? 7 months ago Essential hypertension  ? [place], PA-C  ? 8 months ago No-show for appointment  ? [place], PA-C  ?  ?  ?Future Appointments   ?        ? In 4 weeks Ralene Bathe, MD [place]  ?  ? ?  ?  ?  ? ?

## 2021-10-15 NOTE — Telephone Encounter (Signed)
We need the name of the new pharmacy ? ?LMTCB for the pt ?

## 2021-10-15 NOTE — Telephone Encounter (Signed)
Copied from [place] [id number]. Topic: General - Other ?>> Oct 15, 2021  9:11 AM Loma Boston wrote: ?Medication Refill - Medication: Gwyneth Sprout, [place], Ruth, [place] ? ?Outpatient Medication Detail ? ? Disp Refills Start End  ?omeprazole (PRILOSEC) 20 MG capsule 30 capsule 2 09/03/2021   ?Sig - Route: Take 1 capsule (20 mg total) by mouth daily. - Oral  ?Sent to pharmacy as: omeprazole (PRILOSEC) 20 MG capsule  ?E-Prescribing Status: Receipt confirmed by pharmacy (09/03/2021 11:31 AM   ? ? ? ?Has the patient contacted their pharmacy? Yes.   ?(Agent: If no, request that the patient contact the pharmacy for the refill. If patient does not wish to contact the pharmacy document the reason why and proceed with request.) ?(Agent: If yes, when and what did the pharmacy advise?) Pt needs med to be resubmitted as existing pharmacy in [facility] closed. New Pharmacy below ? ?Preferred Pharmacy (with phone number or street name): [facility] [place], [place]. ?[place] [address] ?Phone: [phone] Fax: [phone] ? ? ?Has the patient been seen for an appointment in the last year OR does the patient have an upcoming appointment? Yes.   ? ?Agent: Please be advised that RX refills may take up to 3 business days. We ask that you follow-up with your pharmacy. ?

## 2021-10-15 NOTE — Telephone Encounter (Signed)
Called patient again and she did not answer. Left voicemail for her to call us back. ?

## 2021-10-15 NOTE — Telephone Encounter (Signed)
Called patient but she did not answer. Left message for her to call back. Will need to see if she was able to get established with Dr. Donneta Romberg (allergist).  ?

## 2021-10-15 NOTE — Telephone Encounter (Signed)
ATC patient. Left a detailed vm letting her know her prescription was sent in to the pharmacy.  ?

## 2021-10-16 NOTE — Telephone Encounter (Signed)
Requested Prescriptions  ?Pending Prescriptions Disp Refills  ?? ipratropium-albuterol (DUONEB) 0.5-2.5 (3) MG/3ML SOLN [Pharmacy Med Name: IPRATROPIUM-ALBUTEROL 0.5-2.5 (3) M] 360 mL 1  ?  Sig: USE 1 VIAL VIA NEBULIZER EVERY 6 HOURS AS NEEDED FOR WHEEZING  ?  ? Pulmonology:  Combination Products - albuterol / ipratropium Failed - 10/14/2021  2:38 PM  ?  ?  Failed - Last BP in normal range  ?  BP Readings from Last 1 Encounters:  ?09/08/21 (!) 155/105  ?   ?  ?  Passed - Last Heart Rate in normal range  ?  Pulse Readings from Last 1 Encounters:  ?09/08/21 78  ?   ?  ?  Passed - Valid encounter within last 12 months  ?  Recent Outpatient Visits   ?      ? 1 month ago Nausea and vomiting, unspecified vomiting type  ? [facility] Birdie Sons, MD  ? 3 months ago Sinusitis, unspecified chronicity, unspecified location  ? [facility] Birdie Sons, MD  ? 4 months ago Right wrist pain  ? [facility] [place], Dionne Bucy, MD  ? 7 months ago Essential hypertension  ? [place], PA-C  ? 8 months ago No-show for appointment  ? [place], PA-C  ?  ?  ?Future Appointments   ?        ? In 3 weeks Ralene Bathe, MD [place]  ?  ? ?  ?  ?  ? ? ?

## 2021-10-23 ENCOUNTER — Ambulatory Visit: Payer: Self-pay | Admitting: *Deleted

## 2021-10-23 ENCOUNTER — Encounter: Payer: Self-pay | Admitting: Physician Assistant

## 2021-10-23 ENCOUNTER — Other Ambulatory Visit: Payer: Self-pay

## 2021-10-23 ENCOUNTER — Ambulatory Visit (INDEPENDENT_AMBULATORY_CARE_PROVIDER_SITE_OTHER): Payer: Medicaid Other | Admitting: Physician Assistant

## 2021-10-23 VITALS — BP 144/100 | HR 81 | Temp 98.1°F | Resp 16 | Ht 63.0 in | Wt 355.9 lb

## 2021-10-23 DIAGNOSIS — R109 Unspecified abdominal pain: Secondary | ICD-10-CM | POA: Diagnosis not present

## 2021-10-23 DIAGNOSIS — I10 Essential (primary) hypertension: Secondary | ICD-10-CM | POA: Diagnosis not present

## 2021-10-23 NOTE — Telephone Encounter (Signed)
?  Chief Complaint: "Stomach burning type pain" ?Symptoms: Mid abdominal "Burning" type discomfort, 4-5/10, nausea ?Frequency: Friday and Sat, constant. No symptoms Sun, Mon, reoccurring this AM. ?Pertinent Negatives: Patient denies Vomiting, diarrhea ?Disposition: '[]'$ ED /'[]'$ Urgent Care (no appt availability in office) / '[x]'$ Appointment(In office/virtual)/ '[]'$  [place] [facility]/ '[]'$ Home Care/ '[]'$ Refused Recommended Disposition /'[]'$ [place] [facility]/ '[]'$  Follow-up with PCP ?Additional Notes: Appt secured for this AM. ? Reason for Disposition ? [1] MILD-MODERATE pain AND [2] constant AND [3] present > 2 hours ? ?Answer Assessment - Initial Assessment Questions ?1. LOCATION: "Where does it hurt?"  ?    Middle ?2. RADIATION: "Does the pain shoot anywhere else?" (e.g., chest, back) ?    no ?3. ONSET: "When did the pain begin?" (e.g., minutes, hours or days ago)  ?    Friday ?4. SUDDEN: "Gradual or sudden onset?" ?    Gradual ?5. PATTERN "Does the pain come and go, or is it constant?" ?   - If constant: "Is it getting better, staying the same, or worsening?"  ?    (Note: Constant means the pain never goes away completely; most serious pain is constant and it progresses)  ?   - If intermittent: "How long does it last?" "Do you have pain now?" ?    (Note: Intermittent means the pain goes away completely between bouts) ?    Constant ?6. SEVERITY: "How bad is the pain?"  (e.g., Scale 1-10; mild, moderate, or severe) ?  - MILD (1-3): doesn't interfere with normal activities, abdomen soft and not tender to touch  ?  - MODERATE (4-7): interferes with normal activities or awakens from sleep, abdomen tender to touch  ?  - SEVERE (8-10): excruciating pain, doubled over, unable to do any normal activities  ?    4/10 ?7. RECURRENT SYMPTOM: "Have you ever had this type of stomach pain before?" If Yes, ask: "When was the last time?" and "What happened that time?"  ?One month ago ?8. CAUSE: "What do you think is causing the stomach  pain?" ?    Usure ?9. RELIEVING/AGGRAVATING FACTORS: "What makes it better or worse?" (e.g., movement, antacids, bowel movement) ?    Pepto Bismol, no help ?10. OTHER SYMPTOMS: "Do you have any other symptoms?" (e.g., back pain, diarrhea, fever, urination pain, vomiting) ?      Nausea ?11. PREGNANCY: "Is there any chance you are pregnant?" "When was your last menstrual period?" ?      Possibility, pd month late, then lasted few days. LAst week ? ?Protocols used: Abdominal Pain - Female-A-AH ? ?

## 2021-10-23 NOTE — Progress Notes (Signed)
. ? ?Established Patient Office Visit ? ?Subjective:  ?Patient ID: Denise Contreras, female    DOB: [birth year]  Age: 31 y.o. MRN: [id number] ? ?CC:  "Stomach burning type pain" ? ? ?HPI ?[name] presents for abdominal pain/ burning.  ?Symptoms: Mid abdominal "Burning" type discomfort, 4-5/10, nausea ?Frequency: Friday and Sat, constant. No symptoms Sun, Mon, reoccurring this AM. ?Has been dieting 3 weeks ago, protein shakes for breakfast, little snakes and at night whatever for dinner. Stopped dieting a week ago. 4 days ago, she had a smoothie that was left in the car( when outside temperature was 84 degree farenheit) and felt she might have a food poisoning.  ?Duration: 4 days ?Nature: constant  ?Location: "middle of stomach" ?Severity: 2/10  ?Radiation: no ?Episode duration:4 days  ?Frequency: constant ?Alleviating factors: "Putting pressure on belly helps" ?Aggravating factors: unclear ?Treatments attempted:Pepto tablets / Nauzene  ?Constipation: no ?Diarrhea: no ?Mucous in the stool: no ?Heartburn: no , when she had an acid reflux , "pain was in the middle of chest" ?Bloating:no ?Flatulence: no ?Nausea: no yes, she has been nauseous ?Vomiting: no ?Melena or hematochezia: no ?Rash: no ?Jaundice: no ?Fever: no ?Weight loss: no ?Yesterday she ate without issues: salad, chicken nuggets, ?However, this morning, she woke up from the abdominal pain again, 5/10 ?Right now pain is 2/10 ? ?Did not start any new medication recently. Planned to proceed with a weight loss surgery 2 years ago but was scared to have surgery. She might reconsider. ?Denies having blood in urine or stool. ?Denies taking NSAIDs or tylenol for more than 2 days. ? ?  ?Past Medical History:  ?Diagnosis Date  ? Allergy   ? Asthma   ? Depression   ? Hypertension   ? ? ?Past Surgical History:  ?Procedure Laterality Date  ? CESAREAN SECTION    ? ? ?Family History  ?Problem Relation Age of Onset  ? COPD Mother   ? Hypertension Mother   ? Alcohol  abuse Mother   ? Cancer Father   ? Hypertension Father   ? Alcohol abuse Paternal Uncle   ? Alcohol abuse Maternal Grandmother   ? COPD Maternal Grandmother   ? Alcohol abuse Maternal Grandfather   ? Hypertension Paternal Grandmother   ? Stroke Paternal Grandmother   ? Cancer Paternal Grandfather   ? ? ?Social History  ? ?Socioeconomic History  ? Marital status: Single  ?  Spouse name: Not on file  ? Number of children: Not on file  ? Years of education: Not on file  ? Highest education level: Not on file  ?Occupational History  ? Not on file  ?Tobacco Use  ? Smoking status: Never  ? Smokeless tobacco: Never  ?Vaping Use  ? Vaping Use: Never used  ?Substance and Sexual Activity  ? Alcohol use: Not Currently  ? Drug use: Never  ? Sexual activity: Yes  ?  Birth control/protection: None  ?Other Topics Concern  ? Not on file  ?Social History Narrative  ? Not on file  ? ?Social Determinants of Health  ? ?Financial Resource Strain: Not on file  ?Food Insecurity: Not on file  ?Transportation Needs: Not on file  ?Physical Activity: Not on file  ?Stress: Not on file  ?Social Connections: Not on file  ?Intimate Partner Violence: Not on file  ? ? ?Outpatient Medications Prior to Visit  ?Medication Sig Dispense Refill  ? Biotin 5 MG TBDP     ? budesonide-formoterol (SYMBICORT) 80-4.5 MCG/ACT inhaler Take  2 puffs first thing in am and then another 2 puffs about 12 hours later. 1 each 12  ? cetirizine (ZYRTEC) 10 MG tablet Take 1 tablet (10 mg total) by mouth daily. 30 tablet 2  ? cholecalciferol (VITAMIN D3) 25 MCG (1000 UNIT) tablet Take 2,000 Units by mouth daily.    ? ibuprofen (ADVIL) 600 MG tablet Take 1 tablet (600 mg total) by mouth every 6 (six) hours as needed. 30 tablet 0  ? ipratropium-albuterol (DUONEB) 0.5-2.5 (3) MG/3ML SOLN USE 1 VIAL VIA NEBULIZER EVERY 6 HOURS AS NEEDED FOR WHEEZING 360 mL 1  ? montelukast (SINGULAIR) 10 MG tablet Take 10 mg by mouth daily.    ? Multiple Vitamin (MULTIVITAMIN) tablet Take 1  tablet by mouth daily.    ? olmesartan (BENICAR) 20 MG tablet Take 1 tablet (20 mg total) by mouth daily. 30 tablet 11  ? omeprazole (PRILOSEC) 20 MG capsule Take 1 capsule (20 mg total) by mouth daily. 30 capsule 2  ? albuterol (VENTOLIN HFA) 108 (90 Base) MCG/ACT inhaler Inhale into the lungs every 6 (six) hours as needed for wheezing or shortness of breath.    ? fluticasone (FLONASE) 50 MCG/ACT nasal spray Place 2 sprays into both nostrils daily. 16 g 0  ? ?No facility-administered medications prior to visit.  ? ? ?Allergies  ?Allergen Reactions  ? Sulfa Antibiotics   ? Nickel Rash  ? ? ?ROS ?Review of Systems  ?Constitutional:  Negative for chills and fever.  ?Respiratory:  Negative for cough, chest tightness and shortness of breath.   ?Cardiovascular:  Negative for chest pain.  ?Gastrointestinal:  Positive for abdominal pain and nausea. Negative for blood in stool, constipation and diarrhea.  ?Genitourinary:  Negative for frequency, hematuria, menstrual problem, pelvic pain, urgency, vaginal discharge and vaginal pain.  ? ?  ?Objective:  ?  ?Physical Exam ?Vitals and nursing note reviewed.  ?Constitutional:   ?   Appearance: Normal appearance.  ?HENT:  ?   Head: Normocephalic and atraumatic.  ?Cardiovascular:  ?   Rate and Rhythm: Normal rate and regular rhythm.  ?   Pulses: Normal pulses.  ?Pulmonary:  ?   Effort: Pulmonary effort is normal.  ?   Breath sounds: Normal breath sounds.  ?Abdominal:  ?   General: Abdomen is flat.  ?   Palpations: Abdomen is soft. There is no mass.  ?   Tenderness: There is no abdominal tenderness. There is no left CVA tenderness, guarding or rebound.  ?Neurological:  ?   General: No focal deficit present.  ?   Mental Status: She is alert and oriented to person, place, and time.  ?Psychiatric:     ?   Behavior: Behavior normal.     ?   Thought Content: Thought content normal.     ?   Judgment: Judgment normal.  ? ? ?BP (!) 144/100 (BP Location: Left Arm, Patient Position: Sitting,  Cuff Size: Large)   Pulse 81   Temp 98.1 ?F (36.7 ?C) (Oral)   Resp 16   Ht 5' 3"  (1.6 m)   Wt (!) 355 lb 14.4 oz (161.4 kg)   SpO2 100%   BMI 63.04 kg/m?  ?Wt Readings from Last 3 Encounters:  ?10/23/21 (!) 355 lb 14.4 oz (161.4 kg)  ?08/27/21 (!) 350 lb (158.8 kg)  ?08/08/21 (!) 346 lb (156.9 kg)  ? ? ? ?Health Maintenance Due  ?Topic Date Due  ? HIV Screening  Never done  ? Hepatitis C Screening  Never done  ? PAP SMEAR-Modifier  Never done  ? COVID-19 Vaccine (3 - Booster for Moderna series) 03/25/2020  ? ? ?There are no preventive care reminders to display for this patient. ? ?Lab Results  ?Component Value Date  ? TSH 3.740 10/03/2020  ? ?Lab Results  ?Component Value Date  ? WBC 7.3 08/27/2021  ? HGB 13.2 08/27/2021  ? HCT 41.3 08/27/2021  ? MCV 86 08/27/2021  ? PLT 317 08/27/2021  ? ?Lab Results  ?Component Value Date  ? NA 138 08/27/2021  ? K 4.7 08/27/2021  ? CO2 23 08/27/2021  ? GLUCOSE 87 08/27/2021  ? BUN 14 08/27/2021  ? CREATININE 0.87 08/27/2021  ? BILITOT 0.3 08/27/2021  ? ALKPHOS 80 08/27/2021  ? AST 17 08/27/2021  ? ALT 20 08/27/2021  ? PROT 6.8 08/27/2021  ? ALBUMIN 4.4 08/27/2021  ? CALCIUM 9.7 08/27/2021  ? ANIONGAP 7 03/02/2017  ? EGFR 92 08/27/2021  ? ?Lab Results  ?Component Value Date  ? CHOL 193 08/27/2021  ? ?Lab Results  ?Component Value Date  ? HDL 37 (L) 08/27/2021  ? ?Lab Results  ?Component Value Date  ? LDLCALC 135 (H) 08/27/2021  ? ?Lab Results  ?Component Value Date  ? TRIG 116 08/27/2021  ? ?Lab Results  ?Component Value Date  ? CHOLHDL 5.2 (H) 08/27/2021  ? ?No results found for: HGBA1C ? ?  ?Assessment & Plan:  ? ?Problem List Items Addressed This Visit   ?None ?Visit Diagnoses   ? ? Abdominal pain, unspecified abdominal location    -  Primary  ? Relevant Orders  ? CBC w/Diff/Platelet  ? Comprehensive Metabolic Panel (CMET)  ? Amylase  ? Lipase  ? Pregnancy, urine (Completed)  ? POCT Pregnancy, Urine  ? ?  ? ?1. Abdominal pain, unspecified abdominal location ?Asymptomatic  with unremarkable PE. ?Ddx GERD, Appendicitis, Diverticulitis, Cholecystitis, Gastritis, Gastroenteritis, Pancreatitis. ?- CBC w/Diff/Platelet ?- Comprehensive Metabolic Panel (CMET) ?- Amylase ?- Lipase ?- POC

## 2021-11-12 ENCOUNTER — Ambulatory Visit (INDEPENDENT_AMBULATORY_CARE_PROVIDER_SITE_OTHER): Payer: Medicaid Other | Admitting: Dermatology

## 2021-11-12 DIAGNOSIS — L80 Vitiligo: Secondary | ICD-10-CM | POA: Diagnosis not present

## 2021-11-12 DIAGNOSIS — D485 Neoplasm of uncertain behavior of skin: Secondary | ICD-10-CM | POA: Diagnosis not present

## 2021-11-12 DIAGNOSIS — D489 Neoplasm of uncertain behavior, unspecified: Secondary | ICD-10-CM

## 2021-11-12 DIAGNOSIS — L720 Epidermal cyst: Secondary | ICD-10-CM | POA: Diagnosis not present

## 2021-11-12 MED ORDER — TACROLIMUS 0.1 % EX OINT
TOPICAL_OINTMENT | Freq: Every day | CUTANEOUS | 3 refills | Status: DC
Start: 2021-11-12 — End: 2021-12-12

## 2021-11-12 NOTE — Progress Notes (Signed)
? ?New Patient Visit ? ?Subjective  ?Denise Contreras is a 31 y.o. female who presents for the following: New Patient (Initial Visit) (Spot under tattoo at left lower leg . Noticed 5  - 6 months ago. Spot at left side of neck that noticed last year and spot at left corner of eye. Reports some history of skin cancer in paternal sister.). ?The patient has spots, moles and lesions to be evaluated, some may be new or changing and the patient has concerns that these could be cancer. ? ?The following portions of the chart were reviewed this encounter and updated as appropriate:  ? Tobacco  Allergies  Meds  Problems  Med Hx  Surg Hx  Fam Hx   ?  ?Review of Systems:  No other skin or systemic complaints except as noted in HPI or Assessment and Plan. ? ?Objective  ?Well appearing patient in no apparent distress; mood and affect are within normal limits. ? ?A focused examination was performed including left neck, left eyelid, left ankle / calf . Relevant physical exam findings are noted in the Assessment and Plan. ? ?Left upper medial canthus ?Smooth white papule ? ?Left Anterior Neck ?Depigmented patches  ? ? ? ? ? ? ?left lateral superior ankle within tattoo ?0.6 cm flesh colored papule  ? ? ? ? ? ? ? ?Assessment & Plan  ?Milia ?Left upper medial canthus ?Benign observe ?No treatment recommended at this time. ?Patient advised if grows larger can remove if wanted.  ? ?Vitiligo ?Left Anterior Neck ?Vitiligo is a chronic autoimmune condition which causes loss of skin pigment and is commonly seen on the face and may also involve areas of trauma like hands, elbows, knees, and ankles. There is no cure and it is difficult to treat.  Treatments include topical steroids and other topical anti-inflammatory ointments/creams and topical and oral Jak inhibitors.  Sometimes narrow band UV light therapy or Xtrac laser is helpful, both of which require twice weekly treatments for at least 3-6 months.  Antioxidant vitamins, such as  Vitamins A,C,E,D, Folic Acid and B12 may be added to enhance treatment. ? ?Recommend opzelura 1.5 % cream but not covered by insurance ?Start tacrolimus 0.1 % ointment - apply to affected area daily  ? ?Discussed checking TSH lab, advised patient to consult with PCP about adding tsh onto already ordered labs ? ?tacrolimus (PROTOPIC) 0.1 % ointment - Left Anterior Neck ?Apply topically daily. Apply to affected area of left neck ? ?Neoplasm of uncertain behavior ?left lateral superior ankle within tattoo ?Epidermal / dermal shaving ? ?Lesion diameter (cm):  0.6 ?Informed consent: discussed and consent obtained   ?Timeout: patient name, date of birth, surgical site, and procedure verified   ?Procedure prep:  Patient was prepped and draped in usual sterile fashion ?Prep type:  Isopropyl alcohol ?Anesthesia: the lesion was anesthetized in a standard fashion   ?Anesthetic:  1% lidocaine w/ epinephrine 1-100,000 buffered w/ 8.4% NaHCO3 ?Instrument used: flexible razor blade   ?Hemostasis achieved with: pressure, aluminum chloride and electrodesiccation   ?Outcome: patient tolerated procedure well   ?Post-procedure details: sterile dressing applied and wound care instructions given   ?Dressing type: bandage and petrolatum   ? ?Specimen 1 - Surgical pathology ?Differential Diagnosis: Nevus vs dysplastic vs tattoo granuloma  ?Check Margins: No ?Nevus vs dysplastic vs tattoo granuloma  ? ?Return for 2 - 3 month vitiligo follow up. ?I, Ruthell Rummage, CMA, am acting as scribe for Sarina Ser, MD. ?Documentation: I have reviewed the  above documentation for accuracy and completeness, and I agree with the above. ? ?Sarina Ser, MD ? ? ? ?

## 2021-11-12 NOTE — Patient Instructions (Addendum)
Biopsy Wound Care Instructions ? ?Leave the original bandage on for 24 hours if possible.  If the bandage becomes soaked or soiled before that time, it is OK to remove it and examine the wound.  A small amount of post-operative bleeding is normal.  If excessive bleeding occurs, remove the bandage, place gauze over the site and apply continuous pressure (no peeking) over the area for 30 minutes. If this does not work, please call our clinic as soon as possible or page your doctor if it is after hours.  ? ?Once a day, cleanse the wound with soap and water. It is fine to shower. If a thick crust develops you may use a Q-tip dipped into dilute hydrogen peroxide (mix 1:1 with water) to dissolve it.  Hydrogen peroxide can slow the healing process, so use it only as needed.   ? ?After washing, apply petroleum jelly (Vaseline) or an antibiotic ointment if your doctor prescribed one for you, followed by a bandage.   ? ?For best healing, the wound should be covered with a layer of ointment at all times. If you are not able to keep the area covered with a bandage to hold the ointment in place, this may mean re-applying the ointment several times a day.  Continue this wound care until the wound has healed and is no longer open.  ? ?Itching and mild discomfort is normal during the healing process. However, if you develop pain or severe itching, please call our office.  ? ?If you have any discomfort, you can take Tylenol (acetaminophen) or ibuprofen as directed on the bottle. (Please do not take these if you have an allergy to them or cannot take them for another reason). ? ?Some redness, tenderness and white or yellow material in the wound is normal healing.  If the area becomes very sore and red, or develops a thick yellow-green material (pus), it may be infected; please notify us.   ? ?If you have stitches, return to clinic as directed to have the stitches removed. You will continue wound care for 2-3 days after the stitches  are removed.  ? ?Wound healing continues for up to one year following surgery. It is not unusual to experience pain in the scar from time to time during the interval.  If the pain becomes severe or the scar thickens, you should notify the office.   ? ?A slight amount of redness in a scar is expected for the first six months.  After six months, the redness will fade and the scar will soften and fade.  The color difference becomes less noticeable with time.  If there are any problems, return for a post-op surgery check at your earliest convenience. ? ?To improve the appearance of the scar, you can use silicone scar gel, cream, or sheets (such as Mederma or Serica) every night for up to one year. These are available over the counter (without a prescription). ? ?Please call our office at [phone] for any questions or concerns. ? ? ?Call and ask primary care if they can add tsh lab  ? ? ?Vitiligo is a chronic autoimmune condition which causes loss of skin pigment and is commonly seen on the face and may also involve areas of trauma like hands, elbows, knees, and ankles. There is no cure and it is difficult to treat.  Treatments include topical steroids and other topical anti-inflammatory ointments/creams and topical and oral Jak inhibitors.  Sometimes narrow band UV light therapy or Xtrac laser is  helpful, both of which require twice weekly treatments for at least 3-6 months.  Antioxidant vitamins, such as Vitamins A,C,E,D, Folic Acid and B12 may be added to enhance treatment. ? ? ? ? ? ? ? ?If You Need Anything After Your Visit ? ?If you have any questions or concerns for your doctor, please call our main line at [phone] and press option 4 to reach your doctor's medical assistant. If no one answers, please leave a voicemail as directed and we will return your call as soon as possible. Messages left after 4 pm will be answered the following business day.  ? ?You may also send [place] a message via MyChart. We  typically respond to MyChart messages within 1-2 business days. ? ?For prescription refills, please ask your pharmacy to contact our office. Our fax number is [phone]. ? ?If you have an urgent issue when the clinic is closed that cannot wait until the next business day, you can page your doctor at the number below.   ? ?Please note that while we do our best to be available for urgent issues outside of office hours, we are not available 24/7.  ? ?If you have an urgent issue and are unable to reach [place], you may choose to seek medical care at your doctor's office, retail clinic, urgent care center, or emergency room. ? ?If you have a medical emergency, please immediately call 911 or go to the emergency department. ? ?Pager Numbers ? ?- Dr. Nehemiah Massed: [phone] ? ?- Dr. Laurence Ferrari: [phone] ? ?- Dr. Nicole Kindred: [phone] ? ?In the event of inclement weather, please call our main line at [phone] for an update on the status of any delays or closures. ? ?Dermatology Medication Tips: ?Please keep the boxes that topical medications come in in order to help keep track of the instructions about where and how to use these. Pharmacies typically print the medication instructions only on the boxes and not directly on the medication tubes.  ? ?If your medication is too expensive, please contact our office at [phone] option 4 or send [place] a message through [place].  ? ?We are unable to tell what your co-pay for medications will be in advance as this is different depending on your insurance coverage. However, we may be able to find a substitute medication at lower cost or fill out paperwork to get insurance to cover a needed medication.  ? ?If a prior authorization is required to get your medication covered by your insurance company, please allow [place] 1-2 business days to complete this process. ? ?Drug prices often vary depending on where the prescription is filled and some pharmacies may offer cheaper prices. ? ?The  website www.goodrx.com contains coupons for medications through different pharmacies. The prices here do not account for what the cost may be with help from insurance (it may be cheaper with your insurance), but the website can give you the price if you did not use any insurance.  ?- You can print the associated coupon and take it with your prescription to the pharmacy.  ?- You may also stop by our office during regular business hours and pick up a GoodRx coupon card.  ?- If you need your prescription sent electronically to a different pharmacy, notify our office through [facility] or by phone at [phone] option 4. ? ? ? ? ?Si Usted Necesita Algo Despu?s de Su Visita ? ?Tambi?n puede enviarnos un mensaje a trav?s de MyChart. Por lo general respondemos a los mensajes de  MyChart en el transcurso de 1 a 2 d?as h?biles. ? ?Para renovar recetas, por favor pida a su farmacia que se ponga en contacto con nuestra oficina. Nuestro n?mero de fax es el [phone]. ? ?Si tiene un asunto urgente cuando la cl?nica est? cerrada y que no puede esperar hasta el siguiente d?a h?bil, puede llamar/localizar a su doctor(a) al n?mero que aparece a continuaci?n.  ? ?Por favor, tenga en cuenta que aunque hacemos todo lo posible para estar disponibles para asuntos urgentes fuera del horario de oficina, no estamos disponibles las 24 horas del d?a, los 7 d?as de la semana.  ? ?Si tiene un problema urgente y no puede comunicarse con nosotros, puede optar por buscar atenci?n m?dica  en el consultorio de su doctor(a), en una cl?nica privada, en un centro de atenci?n urgente o en una sala de emergencias. ? ?Si tiene Engineer, maintenance (IT) m?dica, por favor llame inmediatamente al 911 o vaya a la sala de emergencias. ? ?N?meros de b?per ? ?- Dr. Nehemiah Massed: [phone] ? ?- Dra. Moye: [phone] ? ?- Dra. Nicole Kindred: [phone] ? ?En caso de inclemencias del tiempo, por favor llame a nuestra l?nea principal al [phone] para una  actualizaci?n sobre el estado de cualquier retraso o cierre. ? ?Consejos para la medicaci?n en dermatolog?a: ?Por favor, guarde las cajas en las que vienen los medicamentos de uso t?pico para ayudarle a seguir las instruc

## 2021-11-15 ENCOUNTER — Telehealth: Payer: Self-pay

## 2021-11-15 NOTE — Telephone Encounter (Signed)
Left pt msg to call for bx results/sh 

## 2021-11-15 NOTE — Telephone Encounter (Signed)
-----   Message from Ralene Bathe, MD sent at 11/15/2021  1:23 PM EDT ----- ?Diagnosis ?Skin , left lateral superior ankle within tattoo ?TATTOO ? ?Benign tattoo material seen within skin. ?No abnormal cells ?May be just swelling from the tattoo procedure. ?No further treatment needed. ?

## 2021-11-19 ENCOUNTER — Other Ambulatory Visit: Payer: Self-pay | Admitting: Family Medicine

## 2021-11-20 ENCOUNTER — Telehealth: Payer: Self-pay

## 2021-11-20 ENCOUNTER — Encounter: Payer: Self-pay | Admitting: Dermatology

## 2021-11-20 NOTE — Telephone Encounter (Signed)
Discussed biopsy results with pt  °

## 2021-11-20 NOTE — Telephone Encounter (Signed)
-----   Message from Ralene Bathe, MD sent at 11/15/2021  1:23 PM EDT ----- ?Diagnosis ?Skin , left lateral superior ankle within tattoo ?TATTOO ? ?Benign tattoo material seen within skin. ?No abnormal cells ?May be just swelling from the tattoo procedure. ?No further treatment needed. ?

## 2021-11-30 ENCOUNTER — Encounter: Payer: Self-pay | Admitting: Family Medicine

## 2021-11-30 ENCOUNTER — Telehealth (INDEPENDENT_AMBULATORY_CARE_PROVIDER_SITE_OTHER): Payer: Medicaid Other | Admitting: Family Medicine

## 2021-11-30 DIAGNOSIS — Z3201 Encounter for pregnancy test, result positive: Secondary | ICD-10-CM | POA: Insufficient documentation

## 2021-11-30 DIAGNOSIS — O10011 Pre-existing essential hypertension complicating pregnancy, first trimester: Secondary | ICD-10-CM | POA: Diagnosis not present

## 2021-11-30 MED ORDER — NIFEDIPINE ER OSMOTIC RELEASE 60 MG PO TB24: 60.0000 mg | ORAL_TABLET | Freq: Every day | ORAL | 2 refills | Status: DC

## 2021-11-30 MED ORDER — LABETALOL HCL 200 MG PO TABS: 200.0000 mg | ORAL_TABLET | Freq: Two times a day (BID) | ORAL | 2 refills | Status: AC

## 2021-11-30 NOTE — Progress Notes (Signed)
? ? ?MyChart Video Visit ? ? ? ?Virtual Visit via Video Note  ? ?This visit type was conducted due to national recommendations for restrictions regarding the COVID-19 Pandemic (e.g. social distancing) in an effort to limit this patient's exposure and mitigate transmission in our community. This patient is at least at moderate risk for complications without adequate follow up. This format is felt to be most appropriate for this patient at this time. Physical exam was limited by quality of the video and audio technology used for the visit.  ? ?Patient location: car, stationary  ?Provider location: [facility] ?[address] ?[place], [place] [address] ? ? ?I discussed the limitations of evaluation and management by telemedicine and the availability of in person appointments. The patient expressed understanding and agreed to proceed. ? ?Patient: Denise Contreras   DOB: [birth year]   31 y.o. Female  MRN: [id number] ?Visit Date: 11/30/2021 ? ?Today's healthcare provider: Gwyneth Sprout, FNP  ?Patient presents for new patient visit to establish care.  Introduced to Designer, jewellery role and practice setting.  All questions answered.  Discussed provider/patient relationship and expectations. ? ? ?Needs to change BP medication to pregnancy safe BP medication; positive pregnancy test on 11/29/21.  ? ?Subjective  ?  ?HPI  ? ? ? ?Medications: ?Outpatient Medications Prior to Visit  ?Medication Sig  ? Biotin 5 MG TBDP   ? budesonide-formoterol (SYMBICORT) 80-4.5 MCG/ACT inhaler Take 2 puffs first thing in am and then another 2 puffs about 12 hours later.  ? cetirizine (ZYRTEC) 10 MG tablet TAKE 1 TABLET BY MOUTH ONCE DAILY  ? cholecalciferol (VITAMIN D3) 25 MCG (1000 UNIT) tablet Take 2,000 Units by mouth daily.  ? ibuprofen (ADVIL) 600 MG tablet Take 1 tablet (600 mg total) by mouth every 6 (six) hours as needed.  ? ipratropium-albuterol (DUONEB) 0.5-2.5 (3) MG/3ML SOLN USE 1 VIAL VIA NEBULIZER EVERY  6 HOURS AS NEEDED FOR WHEEZING  ? montelukast (SINGULAIR) 10 MG tablet Take 10 mg by mouth daily.  ? Multiple Vitamin (MULTIVITAMIN) tablet Take 1 tablet by mouth daily.  ? omeprazole (PRILOSEC) 20 MG capsule Take 1 capsule (20 mg total) by mouth daily.  ? tacrolimus (PROTOPIC) 0.1 % ointment Apply topically daily. Apply to affected area of left neck  ? [DISCONTINUED] olmesartan (BENICAR) 20 MG tablet Take 1 tablet (20 mg total) by mouth daily.  ? ?No facility-administered medications prior to visit.  ? ? ?Review of Systems ? ? ? ? Objective  ?  ?There were no vitals taken for this visit. ? ? ? ? ?Physical Exam ?Constitutional:   ?   General: She is not in acute distress. ?   Appearance: Normal appearance. She is obese. She is not toxic-appearing or diaphoretic.  ?Pulmonary:  ?   Effort: Pulmonary effort is normal.  ?Neurological:  ?   General: No focal deficit present.  ?   Mental Status: She is alert.  ?Psychiatric:     ?   Mood and Affect: Mood normal.     ?   Behavior: Behavior normal.     ?   Thought Content: Thought content normal.     ?   Judgment: Judgment normal.  ?  ? ? ? Assessment & Plan  ?  ? ?Problem List Items Addressed This Visit   ? ?  ? Cardiovascular and Mediastinum  ? Pre-existing essential hypertension during pregnancy in first trimester  ?  Will switch to pregnancy safe medication ?-recommend labetalol  200 mg BID; however, requires a PA ?-will send in nifedipine 60 mg QD at this time as it is covered ? ?  ?  ? Relevant Medications  ? NIFEdipine (PROCARDIA XL/NIFEDICAL XL) 60 MG 24 hr tablet  ? labetalol (NORMODYNE) 200 MG tablet  ?  ? Other  ? Positive pregnancy test - Primary  ?  Unplanned pregnancy; plans to keep the child ?Does not know last menses, believes she missed one in April 2023 ?Has upcoming appt with OB on 5/17 ?Positive pregnancy test at home, not confirmed in office d/t tele visit on 11/29/21 ? ?  ?  ? ? ? ?Return in about 4 weeks (around 12/28/2021) for blood pressure check.  ?   ? ?I discussed the assessment and treatment plan with the patient. The patient was provided an opportunity to ask questions and all were answered. The patient agreed with the plan and demonstrated an understanding of the instructions. ?  ?The patient was advised to call back or seek an in-person evaluation if the symptoms worsen or if the condition fails to improve as anticipated. ? ?I provided 10 minutes of face-to-face time during this encounter discussing previous compliance with hypertension medication, current symptoms, and new pregnancy.  ? ?I, Gwyneth Sprout, FNP, have reviewed all documentation for this visit. The documentation on 11/30/21 for the exam, diagnosis, procedures, and orders are all accurate and complete. ? ? ?Gwyneth Sprout, FNP ?[place] ?[phone] (phone) ?[phone] (fax) ? ?[facility]  ? ?

## 2021-11-30 NOTE — Assessment & Plan Note (Signed)
Unplanned pregnancy; plans to keep the child ?Does not know last menses, believes she missed one in April 2023 ?Has upcoming appt with OB on 5/17 ?Positive pregnancy test at home, not confirmed in office d/t tele visit on 11/29/21 ?

## 2021-11-30 NOTE — Assessment & Plan Note (Signed)
Will switch to pregnancy safe medication ?-recommend labetalol 200 mg BID; however, requires a PA ?-will send in nifedipine 60 mg QD at this time as it is covered ?

## 2021-11-30 NOTE — Patient Instructions (Signed)
Hi Denise Contreras, ? ?I would prefer you to be on labetalol, as it has less side effects. However, it appears your insurance requires a prior auth. ? ?I will write for nifedipine at this time, this medication is also safe; however, it can cause flushing/feelings of being warm if you are not hydrated. It can also exacerbate/worsen lower leg swelling. ?

## 2021-12-03 ENCOUNTER — Ambulatory Visit: Payer: Self-pay

## 2021-12-03 ENCOUNTER — Encounter: Payer: Self-pay | Admitting: Family Medicine

## 2021-12-03 NOTE — Telephone Encounter (Signed)
?  Chief Complaint: medication advice ?Symptoms: HTN related pregnancy ?Frequency: NA ?Pertinent Negative: NA ?Disposition: '[]'$ ED /'[]'$ Urgent Care (no appt availability in office) / '[]'$ Appointment(In office/virtual)/ '[]'$  [place] [facility]/ '[]'$ Home Care/ '[]'$ Refused Recommended Disposition /'[]'$ [place] [facility]/ '[x]'$  Follow-up with PCP ?Additional Notes: pt called was asking about the 2 different BP meds prescribed and if she needed to take both of those. I advised pt that PCP prefers to take the Labetalol if no issues with getting them and pt was asking about the dosage being so high. I advised her I will send to provider to f/up to make sure that is the correct dosage since she was currently on Olmesartan '20mg'$ .  ? ? ?Reason for Disposition ? [1] Caller has NON-URGENT medicine question about med that PCP prescribed AND [2] triager unable to answer question ? ?Answer Assessment - Initial Assessment Questions ?1. NAME of MEDICATION: "What medicine are you calling about?" ?    Procardia and labetalol ?2. QUESTION: "What is your question?" (e.g., double dose of medicine, side effect) ?    Am I suppose to be taking both of these ?3. PRESCRIBING HCP: "Who prescribed it?" Reason: if prescribed by specialist, call should be referred to that group. Daneil Dan, NP ?4. SYMPTOMS: "Do you have any symptoms?" ?    HTN during pregnancy ? ?Protocols used: Medication Question Call-A-AH ? ?

## 2021-12-04 NOTE — Telephone Encounter (Signed)
Spoke with patient, she states her understanding.  

## 2021-12-05 ENCOUNTER — Ambulatory Visit: Payer: Medicaid Other | Admitting: Dermatology

## 2021-12-06 ENCOUNTER — Telehealth: Payer: Self-pay | Admitting: Certified Nurse Midwife

## 2021-12-06 NOTE — Telephone Encounter (Signed)
Pt states - "She noticed bleeding not a large amount , just enough to change the color of her urine. Pt states " began 20 mins ago". Pt is concerned she has multiple at home positive preg tests. Pt has an appt scheduled for 05/17 . Please advise  ?

## 2021-12-07 NOTE — Telephone Encounter (Signed)
12/07/21 reached back out to pt in regards to concerns; waiting for pt response.  ?

## 2021-12-12 ENCOUNTER — Encounter: Payer: Self-pay | Admitting: Certified Nurse Midwife

## 2021-12-12 ENCOUNTER — Ambulatory Visit (INDEPENDENT_AMBULATORY_CARE_PROVIDER_SITE_OTHER): Payer: Medicaid Other | Admitting: Certified Nurse Midwife

## 2021-12-12 VITALS — BP 137/85 | HR 88 | Ht 63.0 in | Wt 348.0 lb

## 2021-12-12 DIAGNOSIS — Z3200 Encounter for pregnancy test, result unknown: Secondary | ICD-10-CM

## 2021-12-12 LAB — POCT URINE PREGNANCY: Preg Test, Ur: POSITIVE — AB

## 2021-12-12 NOTE — Patient Instructions (Signed)
Prenatal Care ?Prenatal care is health care during pregnancy. It helps you and your unborn baby (fetus) stay as healthy as possible. Prenatal care may be provided by a midwife, a family practice doctor, a mid-level practitioner (nurse practitioner or physician assistant), or a childbirth and pregnancy doctor (obstetrician). ?How does this affect me? ?During pregnancy, you will be closely monitored for any new conditions that might develop. To lower your risk of pregnancy complications, you and your health care provider will talk about any underlying conditions you have. ?How does this affect my baby? ?Early and consistent prenatal care increases the chance that your baby will be healthy during pregnancy. Prenatal care lowers the risk that your baby will be: ?Born early (prematurely). ?Smaller than expected at birth (small for gestational age). ?What can I expect at the first prenatal care visit? ?Your first prenatal care visit will likely be the longest. You should schedule your first prenatal care visit as soon as you know that you are pregnant. Your first visit is a good time to talk about any questions or concerns you have about pregnancy. ?Medical history ?At your visit, you and your health care provider will talk about your medical history, including: ?Any past pregnancies. ?Your family's medical history. ?Medical history of the baby's father. ?Any long-term (chronic) health conditions you have and how you manage them. ?Any surgeries or procedures you have had. ?Any current over-the-counter or prescription medicines, herbs, or supplements that you are taking. ?Other factors that could pose a risk to your baby, including: ?Exposure to harmful chemicals or radiation at work or at home. ?Any substance use, including tobacco, alcohol, and drug use. ?Your home setting and your stress levels, including: ?Exposure to abuse or violence. ?Household financial strain. ?Your daily health habits, including diet and  exercise. ?Tests and screenings ?Your health care provider will: ?Measure your weight, height, and blood pressure. ?Do a physical exam, including a pelvic and breast exam. ?Perform blood tests and urine tests to check for: ?Urinary tract infection. ?Sexually transmitted infections (STIs). ?Low iron levels in your blood (anemia). ?Blood type and certain proteins on red blood cells (Rh antibodies). ?Infections and immunity to viruses, such as hepatitis B and rubella. ?HIV (human immunodeficiency virus). ?Discuss your options for genetic screening. ?Tips about staying healthy ?Your health care provider will also give you information about how to keep yourself and your baby healthy, including: ?Nutrition and taking vitamins. ?Physical activity. ?How to manage pregnancy symptoms such as nausea and vomiting (morning sickness). ?Infections and substances that may be harmful to your baby and how to avoid them. ?Food safety. ?Dental care. ?Working. ?Travel. ?Warning signs to watch for and when to call your health care provider. ?How often will I have prenatal care visits? ?After your first prenatal care visit, you will have regular visits throughout your pregnancy. The visit schedule is often as follows: ?Up to week 28 of pregnancy: once every 4 weeks. ?28-36 weeks: once every 2 weeks. ?After 36 weeks: every week until delivery. ?Some women may have visits more or less often depending on any underlying health conditions and the health of the baby. ?Keep all follow-up and prenatal care visits. This is important. ?What happens during routine prenatal care visits? ?Your health care provider will: ?Measure your weight and blood pressure. ?Check for fetal heart sounds. ?Measure the height of your uterus in your abdomen (fundal height). This may be measured starting around week 20 of pregnancy. ?Check the position of your baby inside your uterus. ?Ask questions   about your diet, sleeping patterns, and whether you can feel the baby  move. Review warning signs to watch for and signs of labor. Ask about any pregnancy symptoms you are having and how you are dealing with them. Symptoms may include: Headaches. Nausea and vomiting. Vaginal discharge. Swelling. Fatigue. Constipation. Changes in your vision. Feeling persistently sad or anxious. Any discomfort, including back or pelvic pain. Bleeding or spotting. Make a list of questions to ask your health care provider at your routine visits. What tests might I have during prenatal care visits? You may have blood, urine, and imaging tests throughout your pregnancy, such as: Urine tests to check for glucose, protein, or signs of infection. Glucose tests to check for a form of diabetes that can develop during pregnancy (gestational diabetes mellitus). This is usually done around week 24 of pregnancy. Ultrasounds to check your baby's growth and development, to check for birth defects, and to check your baby's well-being. These can also help to decide when you should deliver your baby. A test to check for group B strep (GBS) infection. This is usually done around week 36 of pregnancy. Genetic testing. This may include blood, fluid, or tissue sampling, or imaging tests, such as an ultrasound. Some genetic tests are done during the first trimester and some are done during the second trimester. What else can I expect during prenatal care visits? Your health care provider may recommend getting certain vaccines during pregnancy. These may include: A yearly flu shot (annual influenza vaccine). This is especially important if you will be pregnant during flu season. Tdap (tetanus, diphtheria, pertussis) vaccine. Getting this vaccine during pregnancy can protect your baby from whooping cough (pertussis) after birth. This vaccine may be recommended between weeks 27 and 36 of pregnancy. A COVID-19 vaccine. Later in your pregnancy, your health care provider may give you information  about: Childbirth and breastfeeding classes. Choosing a health care provider for your baby. Umbilical cord banking. Breastfeeding. Birth control after your baby is born. The hospital labor and delivery unit and how to set up a tour. Registering at the hospital before you go into labor. Where to find more information Office on Women's Health: womenshealth.gov American Pregnancy Association: americanpregnancy.org March of Dimes: marchofdimes.org Summary Prenatal care helps you and your baby stay as healthy as possible during pregnancy. Your first prenatal care visit will most likely be the longest. You will have visits and tests throughout your pregnancy to monitor your health and your baby's health. Bring a list of questions to your visits to ask your health care provider. Make sure to keep all follow-up and prenatal care visits. This information is not intended to replace advice given to you by your health care provider. Make sure you discuss any questions you have with your health care provider. Document Revised: 04/27/2020 Document Reviewed: 04/27/2020 Elsevier Patient Education  2023 Elsevier Inc.  

## 2021-12-12 NOTE — Progress Notes (Signed)
Subjective:  ? ? Denise Contreras is a 31 y.o. female who presents for evaluation of amenorrhea. She believes she could be pregnant. Pregnancy is not desired. Sexual Activity: single partner, contraception: none. Current symptoms also include: fatigue and nausea. Last period was unsure of dates. ?  ?Patient's last menstrual period was 10/08/2021 (approximate). ?The following portions of the patient's history were reviewed and updated as appropriate: allergies, current medications, past family history, past medical history, past social history, past surgical history, and problem list. ? ?Review of Systems ?Pertinent items are noted in HPI.   ?  ?Objective:  ? ? BP 137/85   Pulse 88   Ht '5\' 3"'$  (1.6 m)   Wt (!) 348 lb (157.9 kg)   LMP 10/08/2021 (Approximate)   BMI 61.65 kg/m?  ?General: alert, cooperative, appears stated age, and no acute distress   ? ?Lab Review ?Urine HCG: positive  ?  ?Assessment:  ? ? Absence of menstruation.   ?  ?Plan:  ? ? Pregnancy Test:  Positive: EDC: 07/15/2022. Briefly discussed pre-natal care options. . Encouraged well-balanced diet, plenty of rest when needed, pre-natal vitamins daily and walking for exercise. Discussed self-help for nausea, avoiding OTC medications until consulting provider or pharmacist, other than Tylenol as needed, minimal caffeine (1-2 cups daily) and avoiding alcohol. She will schedule u/s for dating ASAP, nurse visit 1 wk,  her initial OB visit in 3 wks. Feel free to call with any questions.  ? ?Philip Aspen, CNM  ?

## 2021-12-18 ENCOUNTER — Ambulatory Visit (INDEPENDENT_AMBULATORY_CARE_PROVIDER_SITE_OTHER): Payer: Medicaid Other

## 2021-12-18 DIAGNOSIS — Z3481 Encounter for supervision of other normal pregnancy, first trimester: Secondary | ICD-10-CM

## 2021-12-18 DIAGNOSIS — Z3200 Encounter for pregnancy test, result unknown: Secondary | ICD-10-CM

## 2021-12-18 DIAGNOSIS — Z3A09 9 weeks gestation of pregnancy: Secondary | ICD-10-CM | POA: Diagnosis not present

## 2021-12-19 ENCOUNTER — Ambulatory Visit: Payer: Medicaid Other | Admitting: Dermatology

## 2021-12-21 ENCOUNTER — Other Ambulatory Visit: Payer: Medicaid Other

## 2022-01-02 ENCOUNTER — Ambulatory Visit: Payer: Medicaid Other | Admitting: Family Medicine

## 2022-01-07 ENCOUNTER — Telehealth: Payer: Self-pay | Admitting: Obstetrics

## 2022-01-07 ENCOUNTER — Other Ambulatory Visit: Payer: Self-pay | Admitting: Obstetrics

## 2022-01-07 ENCOUNTER — Encounter: Payer: Self-pay | Admitting: Obstetrics

## 2022-01-07 MED ORDER — ONDANSETRON HCL 4 MG PO TABS: 4.0000 mg | ORAL_TABLET | Freq: Three times a day (TID) | ORAL | 0 refills | Status: AC | PRN

## 2022-01-07 NOTE — Telephone Encounter (Signed)
Rx sent 

## 2022-01-07 NOTE — Telephone Encounter (Signed)
Patient called in and stated that at her last appointment she was offered a RX for zofran and at the time she felt fine no sickness and did not need it. Pt calls today with complaints of nausea and vomiting and would like an RX for zofran to be sent in. Verified pharmacy with patient as [facility] in [place]. Please advise.

## 2022-01-07 NOTE — Progress Notes (Signed)
Rx for Zofran sent per pt request.  M. Norberto Sorenson, CNM

## 2022-01-08 ENCOUNTER — Telehealth: Payer: Self-pay | Admitting: Obstetrics

## 2022-01-08 ENCOUNTER — Ambulatory Visit (INDEPENDENT_AMBULATORY_CARE_PROVIDER_SITE_OTHER): Payer: Medicaid Other

## 2022-01-08 DIAGNOSIS — Z3689 Encounter for other specified antenatal screening: Secondary | ICD-10-CM

## 2022-01-08 DIAGNOSIS — Z3480 Encounter for supervision of other normal pregnancy, unspecified trimester: Secondary | ICD-10-CM | POA: Insufficient documentation

## 2022-01-08 DIAGNOSIS — O0992 Supervision of high risk pregnancy, unspecified, second trimester: Secondary | ICD-10-CM

## 2022-01-08 NOTE — Telephone Encounter (Signed)
PT is asking for genetic testing at upcoming labs on 6/16. Pt is asking for an order to be placed

## 2022-01-08 NOTE — Initial Assessments (Signed)
New OB Intake  I connected with  Sheppard Penton on 01/08/22 at  9:15 AM EDT by telephone Video Visit and verified that I am speaking with the correct person using two identifiers. Nurse is located at [employer] and pt is located at parking lot at work.  I explained I am completing New OB Intake today. We discussed her EDD of 07/15/2022 that is based on LMP of 10/08/2021. Pt is G3/P2002. I reviewed her allergies, medications, Medical/Surgical/OB history, and appropriate screenings. Based on history, this is a/an pregnancy complicated by hypertension .   Patient Active Problem List   Diagnosis Date Noted   Positive pregnancy test 11/30/2021   Pre-existing essential hypertension during pregnancy in first trimester 11/30/2021   Irregular menses 01/03/2021   Moderate persistent asthma without complication 63/81/7711   Vitamin D insufficiency 07/05/2020   Encounter for gynecological examination 07/05/2020   Depression, major, single episode, moderate ([place]) 07/05/2020   Lumbar pain 07/05/2020   Allergic rhinitis 11/24/2019   Mild intermittent asthma 11/24/2019   Essential hypertension 10/28/2019   BMI 60.0-69.9, adult ([place]) 10/28/2019   Asthma 08/19/2018   Fracture of manubrium, initial encounter for closed fracture 06/20/2018    Concerns addressed today Wants genetic testing to find out the gender; adv that can be drawn with NOB labs; it takes a good two weeks or longer to get results back.  Delivery Plans:  Plans to deliver at [place] to deliver at [facility].  Aware she will have someone she doesn't know deliver her.  Anatomy US Explained first scheduled [place] will be around 20 weeks. Pt has already had dating scan done.  Labs Discussed genetic screening with patient. Patient aware genetic testing to be drawn at new OB visit. Discussed possible labs to be drawn at new OB appointment.  COVID Vaccine Patient has had COVID vaccine.   Social Determinants of  Health Food Insecurity: denies food insecurity Transportation: Patient denies transportation needs.  First visit review I reviewed new OB appt with pt. I explained she will have ob bloodwork and pap smear/pelvic exam if indicated. Explained pt will be seen by Lloyd Huger, CNM at first visit; encounter routed to appropriate provider.   Cleophas Dunker, [facility] 01/08/2022  9:15 AM  Clinical Staff Provider  Office Location  [facility] Dating    Language  English Anatomy US    Flu Vaccine   Genetic Screen  NIPS:   TDaP vaccine    Hgb A1C or  GTT Early : Third trimester :   Covid    LAB RESULTS   Rhogam   Blood Type     Feeding Plan breast Antibody    Contraception Tubal? Rubella    Circumcision yes RPR     Pediatrician  [facility] HBsAg     Support Person Josh HIV    Prenatal Classes no Varicella     GBS  (For PCN allergy, check sensitivities)   BTL Consent  Hep C   VBAC Consent Would like to try Pap      Hgb Electro      CF      SMA

## 2022-01-08 NOTE — Addendum Note (Signed)
Addended by: Cleophas Dunker D on: 01/08/2022 11:05 AM   Modules accepted: Orders

## 2022-01-09 ENCOUNTER — Other Ambulatory Visit: Payer: Self-pay | Admitting: Family Medicine

## 2022-01-09 NOTE — Telephone Encounter (Signed)
Requested Prescriptions  Pending Prescriptions Disp Refills  . omeprazole (PRILOSEC) 20 MG capsule [Pharmacy Med Name: OMEPRAZOLE DR 20 MG CAP] 30 capsule 2    Sig: TAKE 1 CAPSULE BY MOUTH ONCE DAILY     Gastroenterology: Proton Pump Inhibitors Passed - 01/09/2022  9:53 AM      Passed - Valid encounter within last 12 months    Recent Outpatient Visits          1 month ago Positive pregnancy test   [facility] Tally Joe T, FNP   2 months ago Abdominal pain, unspecified abdominal location   [facility] [place], [place], PA-C   4 months ago Nausea and vomiting, unspecified vomiting type   [facility] Birdie Sons, MD   6 months ago Sinusitis, unspecified chronicity, unspecified location   [facility] Birdie Sons, MD   7 months ago Right wrist pain   [facility], Dionne Bucy, MD      Future Appointments            In 3 weeks Ralene Bathe, MD [place]

## 2022-01-11 ENCOUNTER — Other Ambulatory Visit: Payer: Medicaid Other

## 2022-01-11 ENCOUNTER — Other Ambulatory Visit: Payer: Self-pay

## 2022-01-11 DIAGNOSIS — O0992 Supervision of high risk pregnancy, unspecified, second trimester: Secondary | ICD-10-CM

## 2022-01-12 LAB — CBC/D/PLT+RPR+RH+ABO+RUBIGG...
Antibody Screen: NEGATIVE
Basophils Absolute: 0 10*3/uL (ref 0.0–0.2)
Basos: 1 %
EOS (ABSOLUTE): 0.3 10*3/uL (ref 0.0–0.4)
Eos: 5 %
HCV Ab: NONREACTIVE
HIV Screen 4th Generation wRfx: NONREACTIVE
Hematocrit: 36 % (ref 34.0–46.6)
Hemoglobin: 12 g/dL (ref 11.1–15.9)
Hepatitis B Surface Ag: NEGATIVE
Immature Grans (Abs): 0 10*3/uL (ref 0.0–0.1)
Immature Granulocytes: 0 %
Lymphocytes Absolute: 1.4 10*3/uL (ref 0.7–3.1)
Lymphs: 26 %
MCH: 29.6 pg (ref 26.6–33.0)
MCHC: 33.3 g/dL (ref 31.5–35.7)
MCV: 89 fL (ref 79–97)
Monocytes Absolute: 0.3 10*3/uL (ref 0.1–0.9)
Monocytes: 6 %
Neutrophils Absolute: 3.4 10*3/uL (ref 1.4–7.0)
Neutrophils: 62 %
Platelets: 247 10*3/uL (ref 150–450)
RBC: 4.06 x10E6/uL (ref 3.77–5.28)
RDW: 13.4 % (ref 11.7–15.4)
RPR Ser Ql: NONREACTIVE
Rh Factor: POSITIVE
Rubella Antibodies, IGG: 1.43 index (ref >0.99)
Varicella zoster IgG: 643 index (ref >165)
WBC: 5.4 10*3/uL (ref 3.4–10.8)

## 2022-01-12 LAB — HCV INTERPRETATION

## 2022-01-16 LAB — MATERNIT 21 PLUS CORE, BLOOD
Fetal Fraction: 8
Result (T21): NEGATIVE
Trisomy 13 (Patau syndrome): NEGATIVE
Trisomy 18 (Edwards syndrome): NEGATIVE
Trisomy 21 (Down syndrome): NEGATIVE

## 2022-01-16 LAB — SPECIMEN STATUS REPORT

## 2022-01-17 ENCOUNTER — Encounter: Payer: Medicaid Other | Admitting: Obstetrics

## 2022-01-25 ENCOUNTER — Ambulatory Visit: Payer: Self-pay

## 2022-01-25 ENCOUNTER — Telehealth: Payer: Self-pay | Admitting: Obstetrics

## 2022-01-25 NOTE — Telephone Encounter (Signed)
Patient experiencing back pain and requesting a steroid shot due to sciatic pain radiating down her back into her leg. Patient is  15 weeks and 3 days pregnant.     Chief Complaint: Pt. Is [redacted] weeks pregnant and has a history of low back pain. Pain is worse now. Radiates down both legs. Asking about steroid injection for pain. Needs early appointment due to work. Has also called her OB/GYN to see if this is safe during pregnancy. Symptoms: Pain when walking around to low back. Frequency: Has gotten worse with pregnancy Pertinent Negatives: Patient denies any other symptoms. Disposition: '[]'$ ED /'[]'$ Urgent Care (no appt availability in office) / '[]'$ Appointment(In office/virtual)/ '[]'$  [facility]/ '[]'$ Home Care/ '[]'$ Refused Recommended Disposition /'[]'$ [place] Mobile [facility]/ '[x]'$  Follow-up with PCP Additional Notes: Pt. Asking for early morning appointment to discuss treatment for low back pain with PCP. Please advise.  Answer Assessment - Initial Assessment Questions 1. ONSET: "When did the pain begin?"      Worse with pregnancy 2. LOCATION: "Where does it hurt?" (upper, mid or lower back)     Low back 3. SEVERITY: "How bad is the pain?"  (e.g., Scale 1-10; mild, moderate, or severe)   - MILD (1-3): doesn't interfere with normal activities    - MODERATE (4-7): interferes with normal activities or awakens from sleep    - SEVERE (8-10): excruciating pain, unable to do any normal activities      When moving - 6-7 4. PATTERN: "Is the pain constant?" (e.g., yes, no; constant, intermittent)      Comes and goes 5. RADIATION: "Does the pain shoot into your legs or elsewhere?"     Down both legs 6. CAUSE:  "What do you think is causing the back pain?"      Sciatica  7. BACK OVERUSE:  "Any recent lifting of heavy objects, strenuous work or exercise?"     No 8. MEDICATIONS: "What have you taken so far for the pain?" (e.g., nothing, acetaminophen, NSAIDS)     None 9. NEUROLOGIC SYMPTOMS: "Do you  have any weakness, numbness, or problems with bowel/bladder control?"     No 10. OTHER SYMPTOMS: "Do you have any other symptoms?" (e.g., fever, abdominal pain, burning with urination, blood in urine)       No 11. PREGNANCY: "Is there any chance you are pregnant?" (e.g., yes, no; LMP)       Yes - 15 weeks  Protocols used: Back Pain-A-AH

## 2022-01-25 NOTE — Telephone Encounter (Signed)
Pt states she has acute back pain that  she addressed with her PCP. Provider encouraged her to consult  her OB, recommendation was a steroid shot . Pt would like to come in as soon as possible if having a steroid shot is an option. Pt states "she will go to the ED for evaluation if pain continues to be unbearable. Pt states her left leg goes numb a severe  burning  through out and lower back pain. Pt states nothing is relieving the pain. Pt states we can respond via My Chart if no answer by phone. Please return call.

## 2022-01-25 NOTE — Telephone Encounter (Signed)
Spoke with patient, she states her understanding.

## 2022-01-30 NOTE — Telephone Encounter (Signed)
Message relayed to pt

## 2022-02-04 ENCOUNTER — Ambulatory Visit: Payer: Medicaid Other | Admitting: Dermatology

## 2022-02-07 ENCOUNTER — Ambulatory Visit (INDEPENDENT_AMBULATORY_CARE_PROVIDER_SITE_OTHER): Payer: Medicaid Other | Admitting: Obstetrics

## 2022-02-07 VITALS — BP 123/72 | HR 80 | Wt 346.9 lb

## 2022-02-07 DIAGNOSIS — Z3A17 17 weeks gestation of pregnancy: Secondary | ICD-10-CM | POA: Diagnosis not present

## 2022-02-07 LAB — POCT URINALYSIS DIPSTICK OB
Bilirubin, UA: NEGATIVE
Blood, UA: NEGATIVE
Glucose, UA: NEGATIVE
Ketones, UA: NEGATIVE
Leukocytes, UA: NEGATIVE
Nitrite, UA: NEGATIVE
POC,PROTEIN,UA: NEGATIVE
Spec Grav, UA: 1.015 (ref 1.010–1.025)
Urobilinogen, UA: 0.2 E.U./dL
pH, UA: 6 (ref 5.0–8.0)

## 2022-02-07 MED ORDER — ASPIRIN 81 MG PO TBEC: 162.0000 mg | DELAYED_RELEASE_TABLET | Freq: Every day | ORAL | 5 refills | Status: AC

## 2022-02-07 NOTE — Progress Notes (Signed)
NEW OB HISTORY AND PHYSICAL  SUBJECTIVE:       Denise SCHAFFERT is a 31 y.o. [id number] female, Patient's last menstrual period was 10/08/2021 (approximate)., Estimated Date of Delivery: 07/15/22, [redacted]w[redacted]d presents today for establishment of Prenatal Care. This pregnancy was a surprise but is welcomed. She reports mild nausea and headaches. She is feeling some fetal movements.  Social history Partner/Relationship: FOB involved Living situation: Lives with 2 children Work: tDesigner, industrial/productfor elderly Exercise: none Substance use: denies EtOH, tobacco, vape, and recreational drugs   Gynecologic History Patient's last menstrual period was 10/08/2021 (approximate). Normal H/o irregular periods Contraception: none Last Pap: Possibly 2022. CSonomawill look for results.  Obstetric History OB History  Gravida Para Term Preterm AB Living  '3 2 2     2  '$ SAB IAB Ectopic Multiple Live Births          2    # Outcome Date GA Lbr Len/2nd Weight Sex Delivery Anes PTL Lv  3 Current           2 Term           1 Term             Past Medical History:  Diagnosis Date   Allergy    Asthma    Depression    Hypertension     Past Surgical History:  Procedure Laterality Date   CESAREAN SECTION     WISDOM TOOTH EXTRACTION     all four; 2021    Current Outpatient Medications on File Prior to Visit  Medication Sig Dispense Refill   budesonide-formoterol (SYMBICORT) 80-4.5 MCG/ACT inhaler Take 2 puffs first thing in am and then another 2 puffs about 12 hours later. 1 each 12   cetirizine (ZYRTEC) 10 MG tablet TAKE 1 TABLET BY MOUTH ONCE DAILY 30 tablet 2   cholecalciferol (VITAMIN D3) 25 MCG (1000 UNIT) tablet Take 2,000 Units by mouth daily.     labetalol (NORMODYNE) 200 MG tablet Take 1 tablet (200 mg total) by mouth 2 (two) times daily. 180 tablet 2   montelukast (SINGULAIR) 10 MG tablet Take 10 mg by mouth daily.     omeprazole (PRILOSEC) 20 MG capsule TAKE 1 CAPSULE BY MOUTH ONCE  DAILY 90 capsule 3   ondansetron (ZOFRAN) 4 MG tablet Take 1 tablet (4 mg total) by mouth every 8 (eight) hours as needed for nausea or vomiting. 20 tablet 0   Prenatal MV & Min w/FA-DHA (PRENATAL ADULT GUMMY/DHA/FA PO)      No current facility-administered medications on file prior to visit.    Allergies  Allergen Reactions   Sulfa Antibiotics    Nickel Rash    Social History   Socioeconomic History   Marital status: Significant Other    Spouse name: Not on file   Number of children: 2   Years of education: 14   Highest education level: Not on file  Occupational History   Occupation: transportation driver for the elderly  Tobacco Use   Smoking status: Never   Smokeless tobacco: Never  Vaping Use   Vaping Use: Never used  Substance and Sexual Activity   Alcohol use: Not Currently   Drug use: Never   Sexual activity: Yes    Partners: Male    Birth control/protection: None  Other Topics Concern   Not on file  Social History Narrative   Not on file   Social Determinants of Health   Financial Resource Strain: Low Risk  (01/08/2022)  Overall Financial Resource Strain (CARDIA)    Difficulty of Paying Living Expenses: Not hard at all  Food Insecurity: No Food Insecurity (01/08/2022)   Hunger Vital Sign    Worried About Running Out of Food in the Last Year: Never true    Ran Out of Food in the Last Year: Never true  Transportation Needs: No Transportation Needs (01/08/2022)   PRAPARE - Hydrologist (Medical): No    Lack of Transportation (Non-Medical): No  Physical Activity: Inactive (01/08/2022)   Exercise Vital Sign    Days of Exercise per Week: 0 days    Minutes of Exercise per Session: 0 min  Stress: No Stress Concern Present (01/08/2022)   [place]    Feeling of Stress : Not at all  Social Connections: Moderately Isolated (01/08/2022)   Social Connection and Isolation  Panel [NHANES]    Frequency of Communication with Friends and Family: More than three times a week    Frequency of Social Gatherings with Friends and Family: Twice a week    Attends Religious Services: Never    Marine scientist or Organizations: No    Attends Archivist Meetings: Never    Marital Status: Living with partner  Intimate Partner Violence: Not At Risk (01/08/2022)   Humiliation, Afraid, Rape, and Kick questionnaire    Fear of Current or Ex-Partner: No    Emotionally Abused: No    Physically Abused: No    Sexually Abused: No    Family History  Problem Relation Age of Onset   COPD Mother    Hypertension Mother    Alcohol abuse Mother    Cancer Father    Hypertension Father    Alcohol abuse Paternal Uncle    Alcohol abuse Maternal Grandmother    COPD Maternal Grandmother    Alcohol abuse Maternal Grandfather    Hypertension Paternal Grandmother    Stroke Paternal Grandmother    Cancer Paternal Grandfather     The following portions of the patient's history were reviewed and updated as appropriate: allergies, current medications, past OB history, past medical history, past surgical history, past family history, past social history, and problem list.  History obtained from the patient General ROS: positive for  - night sweats negative for - chills, fatigue, or malaise Psychological ROS: positive for - depression negative for - anxiety Ophthalmic ROS: negative for - blurry vision or decreased vision ENT ROS: negative for - sore throat Hematological and Lymphatic ROS: negative for - bleeding problems, bruising, or swollen lymph nodes Endocrine ROS: negative for - breast changes or palpitations Breast ROS: reports h/o benign breast lumps Respiratory ROS: no cough, shortness of breath, or wheezing Cardiovascular ROS: no chest pain or dyspnea on exertion Gastrointestinal ROS: no abdominal pain, change in bowel habits, or black or bloody  stools Genito-Urinary ROS: no dysuria, trouble voiding, or hematuria Musculoskeletal ROS: negative Dermatological ROS: vitiligo   OBJECTIVE: Initial Physical Exam (New OB)  GENERAL APPEARANCE: alert, well appearing HEAD: normocephalic, atraumatic MOUTH: mucous membranes moist, pharynx normal without lesions THYROID: no thyromegaly or masses present BREASTS: patient declined exam LUNGS: clear to auscultation, no wheezes, rales or rhonchi, symmetric air entry HEART: regular rate and rhythm, no murmurs ABDOMEN: soft, nontender, nondistended, no abnormal masses, no epigastric pain and unable to hear FHT secondary to body habitus EXTREMITIES: no redness or tenderness in the calves or thighs SKIN: normal coloration and turgor, no rashes LYMPH  NODES: no adenopathy palpable NEUROLOGIC: alert, oriented, normal speech, no focal findings or movement disorder noted  PELVIC EXAM deferred  ASSESSMENT: Normal pregnancy [redacted]w[redacted]d PLAN: Routine prenatal care. We discussed an overview of prenatal care and when to call. Reviewed diet, exercise, and weight gain recommendations in pregnancy. Discussed [facility] Reviewed anesthesia consult and possible transfer of care due to BMI. [place] [facility] Will schedule [facility] [place] d/t inability to hear FHT today. Will start on ASA 162 mg. I reviewed labs and answered all questions.  See orders  MLloyd Huger CNM

## 2022-02-12 ENCOUNTER — Ambulatory Visit
Admission: RE | Admit: 2022-02-12 | Discharge: 2022-02-12 | Disposition: A | Discharge status: Home / Self Care | Payer: Medicaid Other | Source: Ambulatory Visit | Attending: Obstetrics | Admitting: Obstetrics

## 2022-02-12 DIAGNOSIS — Z3A17 17 weeks gestation of pregnancy: Secondary | ICD-10-CM | POA: Insufficient documentation

## 2022-02-12 DIAGNOSIS — O321XX0 Maternal care for breech presentation, not applicable or unspecified: Secondary | ICD-10-CM | POA: Diagnosis present

## 2022-02-25 ENCOUNTER — Other Ambulatory Visit: Payer: Self-pay | Admitting: Certified Nurse Midwife

## 2022-02-25 DIAGNOSIS — Z3A20 20 weeks gestation of pregnancy: Secondary | ICD-10-CM

## 2022-02-26 ENCOUNTER — Other Ambulatory Visit: Payer: Medicaid Other

## 2022-03-12 ENCOUNTER — Encounter: Payer: Self-pay | Admitting: Certified Nurse Midwife

## 2022-03-12 ENCOUNTER — Telehealth: Payer: Self-pay | Admitting: Certified Nurse Midwife

## 2022-03-12 ENCOUNTER — Ambulatory Visit (INDEPENDENT_AMBULATORY_CARE_PROVIDER_SITE_OTHER): Payer: Medicaid Other

## 2022-03-12 ENCOUNTER — Ambulatory Visit (INDEPENDENT_AMBULATORY_CARE_PROVIDER_SITE_OTHER): Payer: Medicaid Other | Admitting: Certified Nurse Midwife

## 2022-03-12 VITALS — BP 146/90 | HR 93 | Wt 354.3 lb

## 2022-03-12 DIAGNOSIS — O10919 Unspecified pre-existing hypertension complicating pregnancy, unspecified trimester: Secondary | ICD-10-CM

## 2022-03-12 DIAGNOSIS — Z362 Encounter for other antenatal screening follow-up: Secondary | ICD-10-CM

## 2022-03-12 DIAGNOSIS — O99212 Obesity complicating pregnancy, second trimester: Secondary | ICD-10-CM

## 2022-03-12 DIAGNOSIS — Z3A20 20 weeks gestation of pregnancy: Secondary | ICD-10-CM | POA: Diagnosis not present

## 2022-03-12 NOTE — Patient Instructions (Signed)
Round Ligament Pain  The round ligaments are a pair of cord-like tissues that help support the uterus. They can become a source of pain during pregnancy as the ligaments soften and stretch as the baby grows. The pain usually begins in the second trimester (13-28 weeks) of pregnancy, and should only last for a few seconds when it occurs. However, the pain can come and go until the baby is delivered. The pain does not cause harm to the baby. Round ligament pain is usually a short, sharp, and pinching pain, but it can also be a dull, lingering, and aching pain. The pain is felt in the lower side of the abdomen or in the groin. It usually starts deep in the groin and moves up to the outside of the hip area. The pain may happen when you: Suddenly change position, such as quickly going from a sitting to standing position. Do physical activity. Cough or sneeze. Follow these instructions at home: Managing pain  When the pain starts, relax. Then, try any of these methods to help with the pain: Sit down. Flex your knees up to your abdomen. Lie on your side with one pillow under your abdomen and another pillow between your legs. Sit in a warm bath for 15-20 minutes or until the pain goes away. General instructions Watch your condition for any changes. Move slowly when you sit down or stand up. Stop or reduce your physical activities if they cause pain. Avoid long walks if they cause pain. Take over-the-counter and prescription medicines only as told by your health care provider. Keep all follow-up visits. This is important. Contact a health care provider if: Your pain does not go away with treatment. You feel pain in your back that you did not have before. Your medicine is not helping. You have a fever or chills. You have nausea or vomiting. You have diarrhea. You have pain when you urinate. Get help right away if: You have pain that is a rhythmic, cramping pain similar to labor pains. Labor  pains are usually 2 minutes apart, last for about 1 minute, and involve a bearing down feeling or pressure in your pelvis. You have vaginal bleeding. These symptoms may represent a serious problem that is an emergency. Do not wait to see if the symptoms will go away. Get medical help right away. Call your local emergency services (911 in the [place] Do not drive yourself to the hospital. Summary Round ligament pain is felt in the lower abdomen or groin. This pain usually begins in the second trimester (13-28 weeks) and should only last for a few seconds when it occurs. You may notice the pain when you suddenly change position, when you cough or sneeze, or during physical activity. Relaxing, flexing your knees to your abdomen, lying on one side, or taking a warm bath may help to get rid of the pain. Contact your health care provider if the pain does not go away. This information is not intended to replace advice given to you by your health care provider. Make sure you discuss any questions you have with your health care provider. Document Revised: 09/27/2020 Document Reviewed: 09/27/2020 Elsevier Patient Education  2023 Elsevier Inc.  

## 2022-03-12 NOTE — Telephone Encounter (Signed)
Pt called to make [place] aware that she is moving at the end of next week and will transferring her care to a practice in the new [place] - she has requested to cancel upcoming appointments. I made her aware of how to get medical records transferred and that I would make providers aware.

## 2022-03-12 NOTE — Progress Notes (Signed)
Pt left with out being seen by provider, She told the front that she did not know she had an appointment with me and that she had to get to work. Orders placed for MFM referral to complete anatomy scan.   Philip Aspen, CNM

## 2022-03-19 ENCOUNTER — Ambulatory Visit (INDEPENDENT_AMBULATORY_CARE_PROVIDER_SITE_OTHER): Payer: Medicaid Other | Admitting: Obstetrics

## 2022-03-19 VITALS — BP 126/80 | HR 93 | Wt 350.7 lb

## 2022-03-19 DIAGNOSIS — O0992 Supervision of high risk pregnancy, unspecified, second trimester: Secondary | ICD-10-CM

## 2022-03-19 DIAGNOSIS — Z3A23 23 weeks gestation of pregnancy: Secondary | ICD-10-CM

## 2022-03-19 NOTE — Progress Notes (Signed)
ROB at [redacted]w[redacted]d Active baby. Denies ctx, LOF, and vaginal bleeding. Reports pain in left hip. Reviewed stretching exercises. Recommend chiropractor/PT. [place] [facility] per pt. This her last appt with [place] Will have 1-hour glucose with new provider.  MLloyd Huger CNM

## 2022-04-02 ENCOUNTER — Telehealth: Payer: Self-pay | Admitting: Obstetrics

## 2022-04-02 NOTE — Telephone Encounter (Signed)
Patient called and states she is trying to visit the chiropractic for sciatic pain in her hip. Patient states insurance is requiring prior authorization. Patient states name of practice accepted by insurance is [facility]. Pt states fax number is [phone]. Pt states she cannot get an appointment without first having prior authorization. Please advise.

## 2022-04-03 ENCOUNTER — Telehealth: Payer: Self-pay

## 2022-04-03 NOTE — Telephone Encounter (Signed)
She wants to know if we sent her referral to the chiropractor she stated her legs are in pain

## 2022-04-03 NOTE — Telephone Encounter (Signed)
Patient transferred care to [facility] sent mychart message letting her know new office should be the ones responsible to place referral.

## 2022-04-03 NOTE — Telephone Encounter (Signed)
Patient is requesting a an referral to [facility] for her chiropractic for sciatic pain in her hip. Please advise?

## 2022-04-18 ENCOUNTER — Ambulatory Visit: Payer: Medicaid Other

## 2022-04-30 ENCOUNTER — Other Ambulatory Visit: Payer: Medicaid Other

## 2022-05-01 ENCOUNTER — Other Ambulatory Visit: Payer: Self-pay | Admitting: Family Medicine

## 2022-05-01 NOTE — Telephone Encounter (Signed)
Requested Prescriptions  Pending Prescriptions Disp Refills  . cetirizine (ZYRTEC) 10 MG tablet [Pharmacy Med Name: CETIRIZINE HCL 10 MG ORAL TABLET] 90 tablet 0    Sig: TAKE 1 TABLET BY MOUTH ONCE DAILY     Ear, Nose, and Throat:  Antihistamines 2 Passed - 05/01/2022  9:37 AM      Passed - Cr in normal range and within 360 days    Creatinine, Ser  Date Value Ref Range Status  08/27/2021 0.87 0.57 - 1.00 mg/dL Final         Passed - Valid encounter within last 12 months    Recent Outpatient Visits          5 months ago Positive pregnancy test   [facility] Tally Joe T, FNP   6 months ago Abdominal pain, unspecified abdominal location   [place], Cunningham, PA-C   8 months ago Nausea and vomiting, unspecified vomiting type   [facility] Birdie Sons, MD   10 months ago Sinusitis, unspecified chronicity, unspecified location   [facility] Birdie Sons, MD   11 months ago Right wrist pain   [facility] [place], Dionne Bucy, MD

## 2022-05-19 IMAGING — CR DG WRIST COMPLETE 3+V*R*
1 series · 4 of 4 positions shown · non-contrast
Comparison: None.

CLINICAL DATA: Ulnar right wrist pain for 3-4 months.

EXAM:
RIGHT WRIST - COMPLETE 3+ VIEW

[Series 1: dg wrist complete right · 0.14mm/px · 4 of 4 slices shown]
[im 1/4]
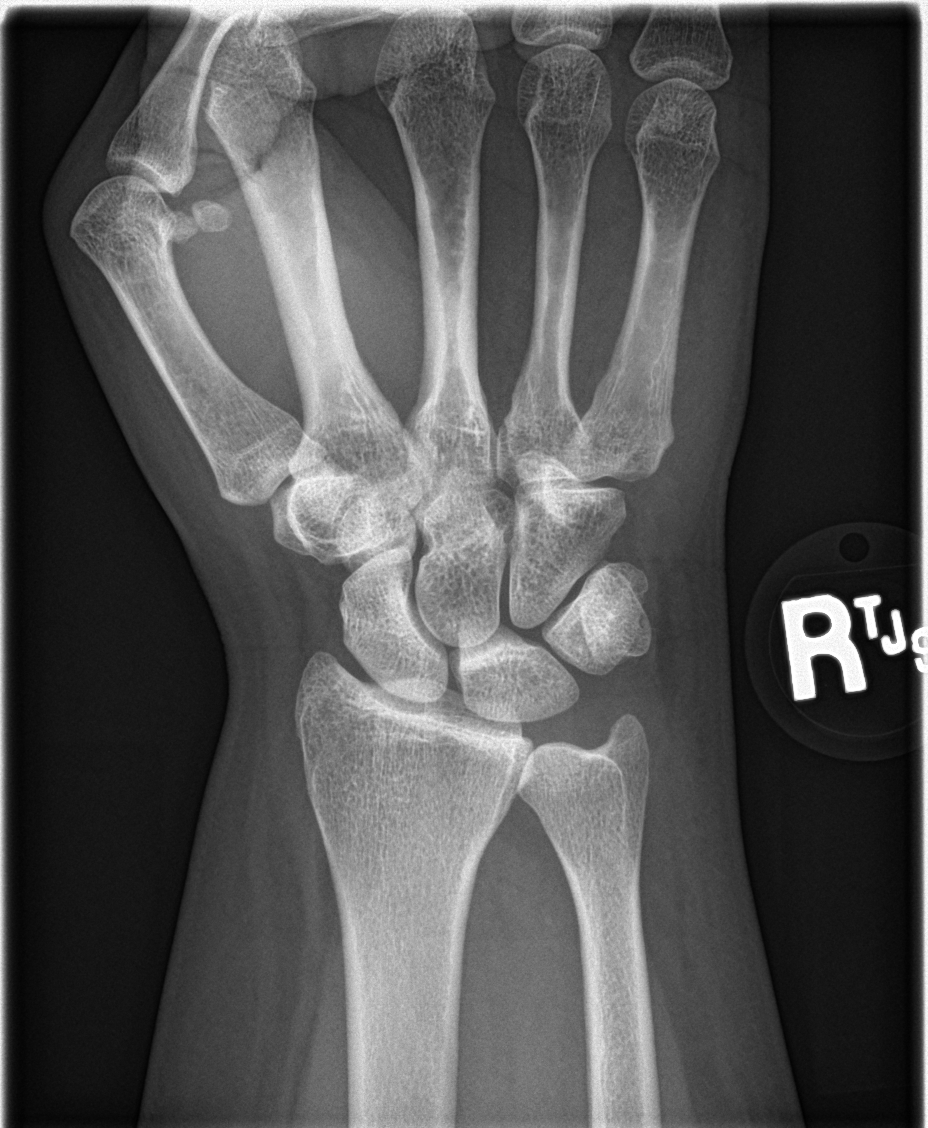
[im 2/4]
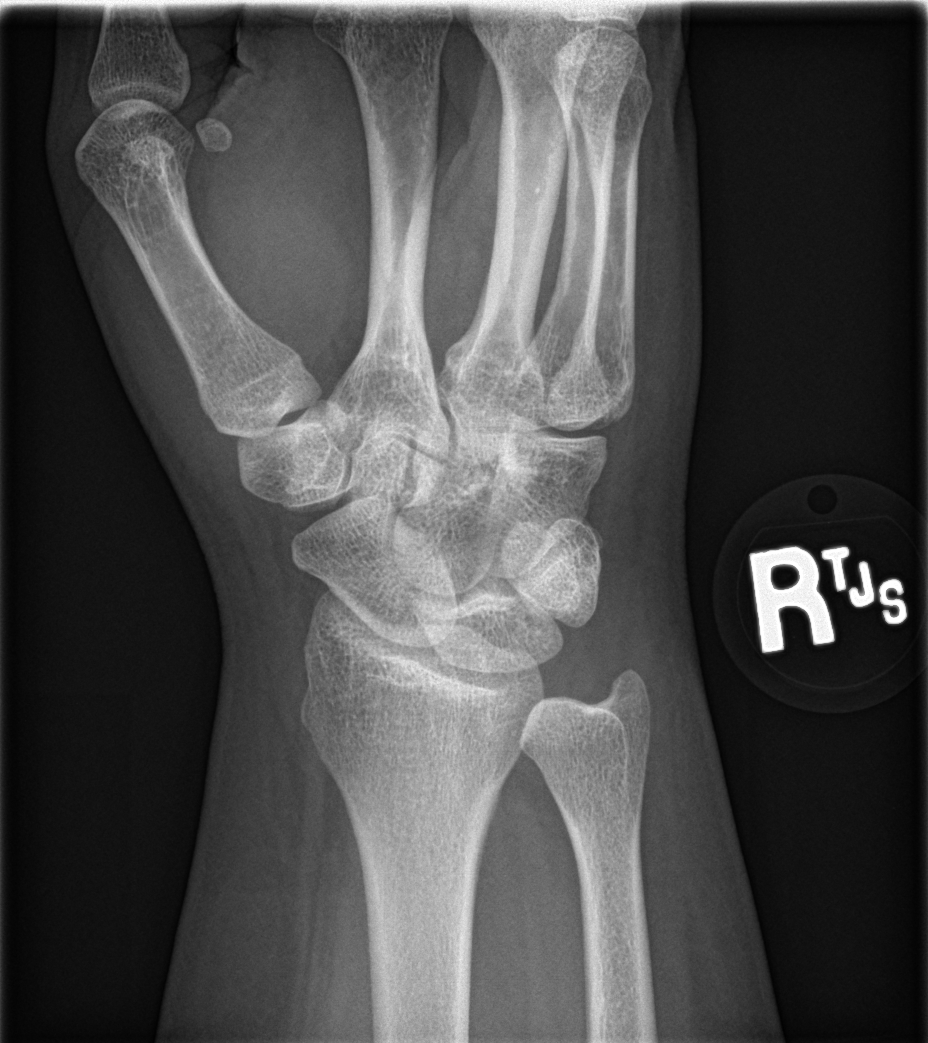
[im 3/4]
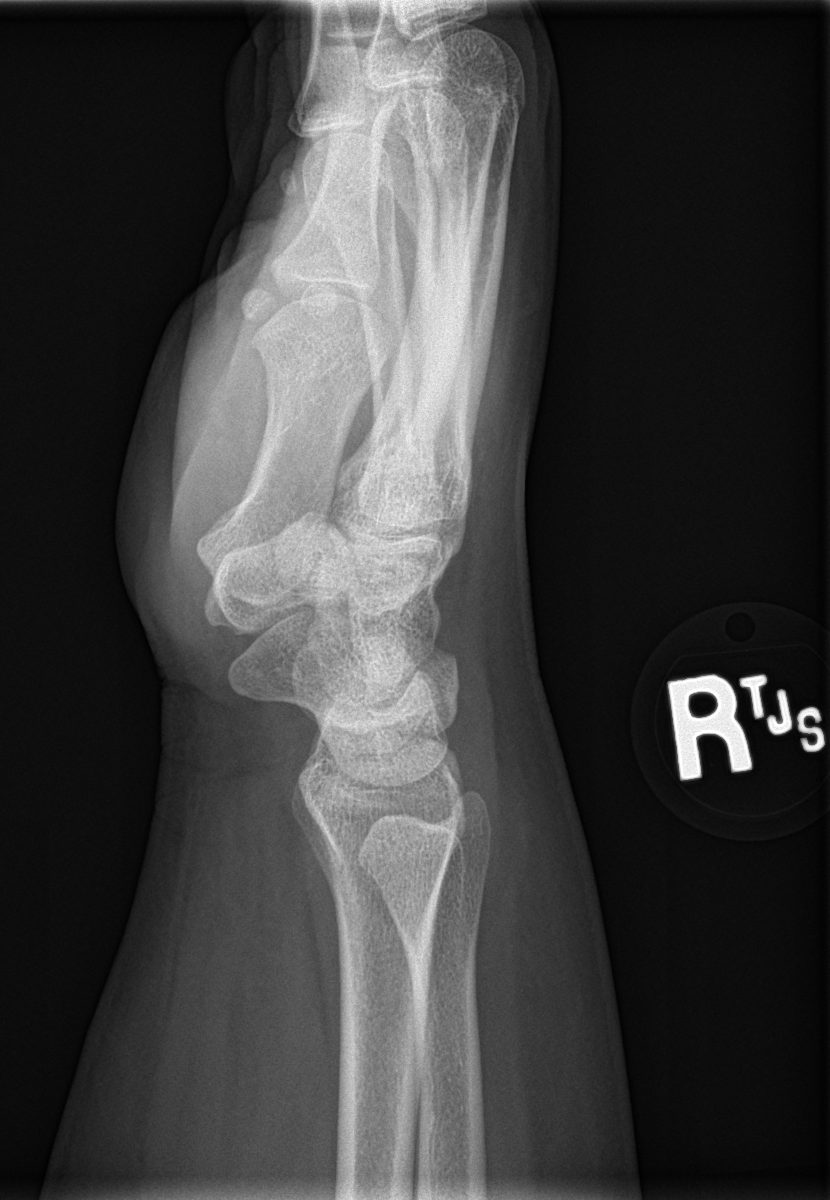
[im 4/4]
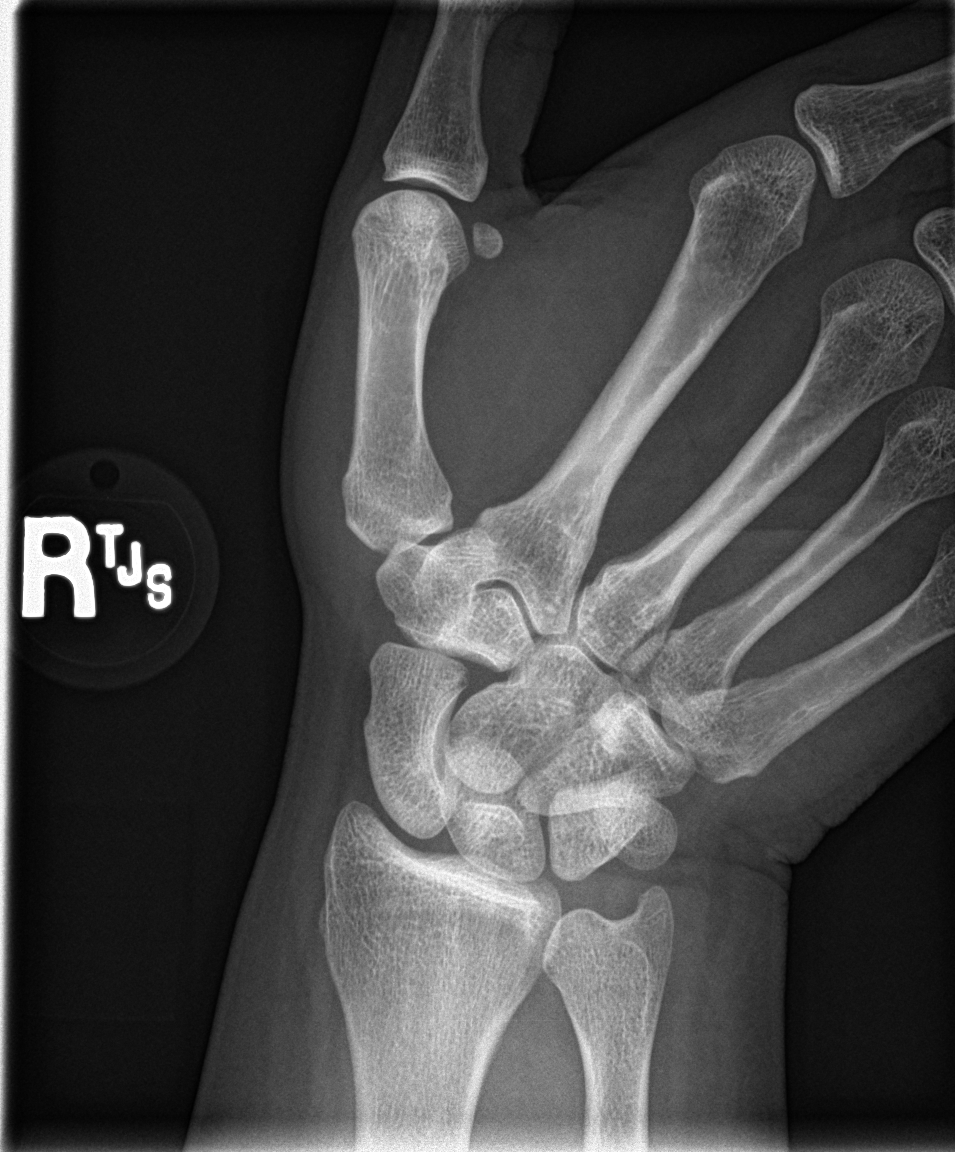

[4 of 4 positions shown; findings below may reference images not displayed]

FINDINGS: Neutral ulnar variance. Joint spaces are preserved. No acute
fracture is seen. No dislocation.
IMPRESSION: Normal right wrist radiographs.

## 2022-05-23 ENCOUNTER — Ambulatory Visit: Payer: Self-pay

## 2022-05-23 NOTE — Telephone Encounter (Signed)
Called patient to give provider note. LMTCB. Okay for PEC to advise.

## 2022-05-23 NOTE — Telephone Encounter (Signed)
  Chief Complaint: Hip pain - leg pain Symptoms:  Frequency: 3 weeks Pertinent Negatives: Patient denies fever Disposition: '[]'$ ED /'[]'$ Urgent Care (no appt availability in office) / '[]'$ Appointment(In office/virtual)/ '[]'$  [facility]/ '[]'$ Home Care/ '[]'$ Refused Recommended Disposition /'[]'$ [facility]/ '[x]'$  Follow-up with PCP Additional Notes: Pt is 31w 5d pregnant and has been experiencing pain of 10/10 in her left hip and leg. Her Physical therapist told her that her ligaments have already loosened and that is what is causing this pain. Pt is high risk pregnancy. Pt went to ED was seen and they were unable to treat pain. Pt will call OB today and insist upon an appt. PT will call back if needed.  Reason for Disposition  [1] SEVERE back pain (e.g., excruciating, unable to do any normal activities) AND [2] not improved 2 hours after pain medicine  Answer Assessment - Initial Assessment Questions 1. ONSET: "When did the pain begin?"      Since 28 weeks 2. LOCATION: "Where does it hurt?" (upper, mid or lower back)     Left hip and leg 3. SEVERITY: "How bad is the pain?"  (e.g., Scale 1-10; mild, moderate, or severe)   - MILD (1-3): Doesn't interfere with normal activities.    - MODERATE (4-7): Interferes with normal activities or awakens from sleep.    - SEVERE (8-10): Excruciating pain, unable to do any normal activities.      10/10 4. PATTERN: "Is the pain constant?" (e.g., yes, no; constant, intermittent)      Yes 5. RADIATION: "Does the pain shoot into your legs or somewhere else?"     Left leg 6. CAUSE:  "What do you think is causing the back pain?"      Pelvis 7. BACK OVERUSE:  "Any recent lifting of heavy objects, strenuous work or exercise?"     no 8. MEDICINES: "What have you taken so far for the pain?" (e.g., nothing, acetaminophen)      9. NEUROLOGIC SYMPTOMS: "Do you have any weakness, numbness, or problems with bowel/bladder control?"      10. OTHER SYMPTOMS: "Do  you have any other symptoms?" (e.g., abdomen pain, blood in urine, fever, fluid leaking from vagina, pain with urination)        11. EDD: "What date are you expecting to deliver?"       31 weeks 5 days  Protocols used: Pregnancy - Back Pain-A-AH

## 2022-05-23 NOTE — Telephone Encounter (Signed)
Called and spoke with patient in regards to provider's recommendations. Patient verbal understanding and stated that she has a future visit with her OB provider and will discuss hip pain further with them.

## 2022-07-23 ENCOUNTER — Telehealth: Payer: Self-pay | Admitting: Family Medicine

## 2022-07-23 NOTE — Telephone Encounter (Signed)
Transition Care Management Unsuccessful Follow-up Telephone Call  Date of discharge and from where:  [facility] on 07/19/22  Attempts:  1st Attempt  Reason for unsuccessful TCM follow-up call:  Left voice message    

## 2022-07-26 ENCOUNTER — Telehealth: Payer: Self-pay

## 2022-07-26 NOTE — Telephone Encounter (Signed)
Transition Care Management Unsuccessful Follow-up Telephone Call  Date of discharge and from where:    Attempts:  2nd Attempt  Reason for unsuccessful TCM follow-up call:  Left voice message

## 2022-12-19 ENCOUNTER — Other Ambulatory Visit: Payer: Self-pay | Admitting: Family Medicine

## 2023-04-07 ENCOUNTER — Ambulatory Visit: Payer: Medicaid Other | Admitting: Dermatology

## 2023-05-07 ENCOUNTER — Ambulatory Visit: Payer: Medicaid Other | Admitting: Dermatology

## 2023-05-26 ENCOUNTER — Ambulatory Visit: Payer: Medicaid Other | Admitting: Dermatology

## 2023-06-11 ENCOUNTER — Ambulatory Visit: Payer: Medicaid Other | Admitting: Dermatology

## 2023-06-16 ENCOUNTER — Other Ambulatory Visit: Payer: Self-pay | Admitting: Family Medicine

## 2023-06-16 DIAGNOSIS — N6310 Unspecified lump in the right breast, unspecified quadrant: Secondary | ICD-10-CM

## 2023-06-16 DIAGNOSIS — N644 Mastodynia: Secondary | ICD-10-CM

## 2023-06-16 DIAGNOSIS — Z803 Family history of malignant neoplasm of breast: Secondary | ICD-10-CM

## 2023-06-20 ENCOUNTER — Ambulatory Visit
Admission: RE | Admit: 2023-06-20 | Discharge: 2023-06-20 | Disposition: A | Discharge status: Home / Self Care | Payer: Medicaid Other | Source: Ambulatory Visit | Attending: Family Medicine

## 2023-06-20 ENCOUNTER — Ambulatory Visit: Payer: Medicaid Other | Admitting: Student in an Organized Health Care Education/Training Program

## 2023-06-20 DIAGNOSIS — N644 Mastodynia: Secondary | ICD-10-CM

## 2023-06-20 DIAGNOSIS — Z803 Family history of malignant neoplasm of breast: Secondary | ICD-10-CM | POA: Insufficient documentation

## 2023-06-20 DIAGNOSIS — N6310 Unspecified lump in the right breast, unspecified quadrant: Secondary | ICD-10-CM | POA: Diagnosis present

## 2023-06-23 ENCOUNTER — Ambulatory Visit: Payer: Medicaid Other | Admitting: Dermatology

## 2023-06-24 ENCOUNTER — Ambulatory Visit: Payer: Medicaid Other | Admitting: Dermatology

## 2023-07-01 ENCOUNTER — Ambulatory Visit: Payer: Medicaid Other | Admitting: Dermatology

## 2023-08-19 ENCOUNTER — Ambulatory Visit: Payer: Medicaid Other | Admitting: Dermatology
